# Patient Record
Sex: Female | Born: 1965
Health system: Southern US, Community
[De-identification: ages and names within clinical notes are randomized; demographics above are authoritative.]

## PROBLEM LIST (undated history)

## (undated) DIAGNOSIS — Z973 Presence of spectacles and contact lenses: Secondary | ICD-10-CM

## (undated) DIAGNOSIS — M1990 Unspecified osteoarthritis, unspecified site: Secondary | ICD-10-CM

## (undated) DIAGNOSIS — I501 Left ventricular failure: Secondary | ICD-10-CM

## (undated) DIAGNOSIS — Z8619 Personal history of other infectious and parasitic diseases: Secondary | ICD-10-CM

## (undated) DIAGNOSIS — E039 Hypothyroidism, unspecified: Secondary | ICD-10-CM

## (undated) DIAGNOSIS — Z8489 Family history of other specified conditions: Secondary | ICD-10-CM

## (undated) DIAGNOSIS — N8110 Cystocele, unspecified: Secondary | ICD-10-CM

## (undated) DIAGNOSIS — Z860100 Personal history of colon polyps, unspecified: Secondary | ICD-10-CM

## (undated) DIAGNOSIS — Z8719 Personal history of other diseases of the digestive system: Secondary | ICD-10-CM

## (undated) HISTORY — DX: Personal history of colonic polyps: Z86.010

## (undated) HISTORY — DX: Personal history of colon polyps, unspecified: Z86.0100

## (undated) HISTORY — DX: Hypothyroidism, unspecified: E03.9

## (undated) HISTORY — DX: Personal history of other infectious and parasitic diseases: Z86.19

## (undated) HISTORY — DX: Personal history of other diseases of the digestive system: Z87.19

## (undated) HISTORY — PX: DIAGNOSTIC LAPAROSCOPY: SUR761

## (undated) HISTORY — DX: Left ventricular failure, unspecified: I50.1

## (undated) HISTORY — PX: TONSILLECTOMY: SUR1361

## (undated) HISTORY — PX: CHOLECYSTECTOMY: SHX55

---
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
## 1997-10-06 HISTORY — PX: VARICOSE VEIN SURGERY: SHX832

## 2006-10-06 HISTORY — PX: CHOLECYSTECTOMY, LAPAROSCOPIC: SHX56

## 2009-10-06 HISTORY — PX: PLANTAR FASCIA SURGERY: SHX746

## 2013-10-06 HISTORY — PX: COLONOSCOPY: SHX174

## 2014-03-02 LAB — HM PAP SMEAR: HM Pap smear: NORMAL

## 2014-06-19 LAB — HM COLONOSCOPY

## 2015-04-30 LAB — VITAMIN D 25 HYDROXY (VIT D DEFICIENCY, FRACTURES): Vit D, 25-Hydroxy: 41

## 2016-07-09 LAB — HM MAMMOGRAPHY: HM MAMMO: NORMAL (ref 0–4)

## 2016-09-16 LAB — HEPATIC FUNCTION PANEL
ALT: 23 U/L (ref 7–35)
AST: 22 U/L (ref 13–35)
Alkaline Phosphatase: 94 U/L (ref 25–125)
BILIRUBIN, TOTAL: 0.4 mg/dL

## 2016-09-16 LAB — HCV ANTIBODY

## 2016-09-16 LAB — LIPID PANEL
Cholesterol: 230 mg/dL — AB (ref 0–200)
HDL: 78 mg/dL — AB (ref 35–70)
LDL CALC: 129 mg/dL
Triglycerides: 113 mg/dL (ref 40–160)

## 2016-09-16 LAB — BASIC METABOLIC PANEL
CREATININE: 0.8 mg/dL (ref <=1.1)
GLUCOSE: 88 mg/dL
POTASSIUM: 4.4 mmol/L (ref 3.4–5.3)
SODIUM: 143 mmol/L (ref 137–147)

## 2016-09-16 LAB — CBC AND DIFFERENTIAL
HEMOGLOBIN: 14.2 g/dL (ref 12.0–16.0)
Platelets: 290 10*3/uL (ref 150–399)
WBC: 5.6 10^3/mL

## 2016-09-16 LAB — VITAMIN B12: VITAMIN B 12: 304

## 2016-09-16 LAB — TSH: TSH: 3.29 u[IU]/mL (ref <=5.90)

## 2016-09-16 LAB — HIV ANTIBODY (ROUTINE TESTING W REFLEX): HIV: NEGATIVE

## 2017-01-05 ENCOUNTER — Ambulatory Visit (INDEPENDENT_AMBULATORY_CARE_PROVIDER_SITE_OTHER): Payer: 59 | Admitting: Family Medicine

## 2017-01-05 ENCOUNTER — Encounter: Payer: Self-pay | Admitting: Family Medicine

## 2017-01-05 VITALS — BP 128/86 | HR 57 | Temp 98.1°F | Ht 68.0 in | Wt 240.5 lb

## 2017-01-05 DIAGNOSIS — G8929 Other chronic pain: Secondary | ICD-10-CM

## 2017-01-05 DIAGNOSIS — E039 Hypothyroidism, unspecified: Secondary | ICD-10-CM | POA: Diagnosis not present

## 2017-01-05 DIAGNOSIS — M25511 Pain in right shoulder: Secondary | ICD-10-CM | POA: Diagnosis not present

## 2017-01-05 DIAGNOSIS — E669 Obesity, unspecified: Secondary | ICD-10-CM

## 2017-01-05 MED ORDER — LEVOTHYROXINE SODIUM 50 MCG PO TABS: 50.0000 ug | ORAL_TABLET | Freq: Every day | ORAL | 3 refills | Status: DC

## 2017-01-05 NOTE — Progress Notes (Signed)
Pre visit review using our clinic review tool, if applicable. No additional management support is needed unless otherwise documented below in the visit note. 

## 2017-01-05 NOTE — Addendum Note (Signed)
Addended by: Ria Bush on: 01/05/2017 10:04 AM   Modules accepted: Orders

## 2017-01-05 NOTE — Patient Instructions (Addendum)
You are doing well today - return as needed or for physical in December.  Continue current medicines. Do exercises for rotator cuff injury provided today.

## 2017-01-05 NOTE — Assessment & Plan Note (Signed)
Anticipate acute flare of RTC tendinopathy or other RTC injury. Not as consistent today with biceps tendonitis. Reviewed supportive care - continue NSAID, ice, provided with exercises from [facility] pt advisor on RTC injury. Pt agrees with plan.

## 2017-01-05 NOTE — Assessment & Plan Note (Signed)
Will continue to monitor.

## 2017-01-05 NOTE — Assessment & Plan Note (Signed)
Chronic, stable. Continue current regimen. 

## 2017-01-05 NOTE — Progress Notes (Signed)
BP 128/86 (BP Location: Left Arm, Patient Position: Sitting, Cuff Size: Large)   Pulse (!) 57   Temp 98.1 F (36.7 C) (Oral)   Ht 5\' 8"  (1.727 m)   Wt 240 lb 8 oz (109.1 kg)   SpO2 95%   BMI 36.57 kg/m    CC: new pt to establish care Subjective:    Patient ID: Kirsten Barnes, female    DOB: 1966-04-11, 51 y.o.   MRN: [id number]  HPI: Kirsten Barnes is a 51 y.o. female presenting on 01/05/2017 for Establish Care   Prior saw Dr Sela Hua [facility].  Biceps tendonitis 07/2016 - on ibuprofen for this.  Possible plantar fasciitis L>R - already knows things to do.  Acquired hypothyroidism initial hyper leading to hospitalization for left heart failure (1988).   Preventative: CPE 09/2016 Colon screening 06/2014 Pap 2015 mammo 07/2016 G2P2  LMP ~2012 menopause at age 62 Tetanus 09/2016  From [place]  Lives with husband  Occ: Marine scientist, housewife  Activity: gardening, golf Diet: good water, fruits/vegetables daily, enjoys cooking  Relevant past medical, surgical, family and social history reviewed and updated as indicated. Interim medical history since our last visit reviewed. Allergies and medications reviewed and updated. No outpatient prescriptions prior to visit.   No facility-administered medications prior to visit.      Per HPI unless specifically indicated in ROS section below Review of Systems     Objective:    BP 128/86 (BP Location: Left Arm, Patient Position: Sitting, Cuff Size: Large)   Pulse (!) 57   Temp 98.1 F (36.7 C) (Oral)   Ht 5\' 8"  (1.727 m)   Wt 240 lb 8 oz (109.1 kg)   SpO2 95%   BMI 36.57 kg/m   Wt Readings from Last 3 Encounters:  01/05/17 240 lb 8 oz (109.1 kg)    Physical Exam  Constitutional: She appears well-developed and well-nourished. No distress.  HENT:  Head: Normocephalic and atraumatic.  Mouth/Throat: Oropharynx is clear and moist. No oropharyngeal exudate.  Eyes: Conjunctivae and EOM are  normal. Pupils are equal, round, and reactive to light. No scleral icterus.  Neck: Normal range of motion. Neck supple. No thyromegaly present.  Cardiovascular: Normal rate, regular rhythm, normal heart sounds and intact distal pulses.   No murmur heard. Pulmonary/Chest: Effort normal and breath sounds normal. No respiratory distress. She has no wheezes. She has no rales.  Musculoskeletal: She exhibits no edema.  Left shoulder WNL Right shoulder exam: No deformity of shoulders on inspection. Tender with palpation of anterior shoulder  FROM in abduction and forward flexion. Tender with testing SITS in ext/int rotation. ++ pain with empty can sign. Neg Yerguson, Speed test. Mild impingement. No significant pain with rotation of humeral head in [place] joint.   Lymphadenopathy:    She has no cervical adenopathy.  Skin: Skin is warm and dry. No rash noted. No erythema.  Psychiatric: She has a normal mood and affect.  Nursing note and vitals reviewed.  No results found for this or any previous visit.    Assessment & Plan:   Problem List Items Addressed This Visit    Hypothyroidism    Chronic, stable. Continue current regimen.       Relevant Medications   levothyroxine (SYNTHROID, LEVOTHROID) 50 MCG tablet   Obesity, Class II, BMI 35-39.9, no comorbidity    Will continue to monitor      Right shoulder pain - Primary    Anticipate acute flare  of RTC tendinopathy or other RTC injury. Not as consistent today with biceps tendonitis. Reviewed supportive care - continue NSAID, ice, provided with exercises from [facility] pt advisor on RTC injury. Pt agrees with plan.           Follow up plan: Return in about 9 months (around 10/07/2017) for annual exam, prior fasting for blood work.  Ria Bush, MD

## 2017-01-09 ENCOUNTER — Ambulatory Visit: Payer: Self-pay | Admitting: Family Medicine

## 2017-01-25 ENCOUNTER — Encounter: Payer: Self-pay | Admitting: Family Medicine

## 2017-01-25 DIAGNOSIS — E559 Vitamin D deficiency, unspecified: Secondary | ICD-10-CM | POA: Insufficient documentation

## 2017-01-25 DIAGNOSIS — E538 Deficiency of other specified B group vitamins: Secondary | ICD-10-CM | POA: Insufficient documentation

## 2017-01-28 ENCOUNTER — Encounter: Payer: Self-pay | Admitting: *Deleted

## 2017-09-17 ENCOUNTER — Encounter: Payer: Self-pay | Admitting: Family Medicine

## 2017-09-17 ENCOUNTER — Ambulatory Visit (INDEPENDENT_AMBULATORY_CARE_PROVIDER_SITE_OTHER): Payer: 59 | Admitting: Family Medicine

## 2017-09-17 VITALS — BP 120/82 | HR 63 | Temp 97.8°F | Ht 67.5 in | Wt 232.5 lb

## 2017-09-17 DIAGNOSIS — E538 Deficiency of other specified B group vitamins: Secondary | ICD-10-CM

## 2017-09-17 DIAGNOSIS — E559 Vitamin D deficiency, unspecified: Secondary | ICD-10-CM | POA: Diagnosis not present

## 2017-09-17 DIAGNOSIS — M25511 Pain in right shoulder: Secondary | ICD-10-CM

## 2017-09-17 DIAGNOSIS — E039 Hypothyroidism, unspecified: Secondary | ICD-10-CM

## 2017-09-17 DIAGNOSIS — Z1231 Encounter for screening mammogram for malignant neoplasm of breast: Secondary | ICD-10-CM

## 2017-09-17 DIAGNOSIS — E669 Obesity, unspecified: Secondary | ICD-10-CM

## 2017-09-17 DIAGNOSIS — Z0000 Encounter for general adult medical examination without abnormal findings: Secondary | ICD-10-CM | POA: Diagnosis not present

## 2017-09-17 DIAGNOSIS — E785 Hyperlipidemia, unspecified: Secondary | ICD-10-CM

## 2017-09-17 DIAGNOSIS — Z1239 Encounter for other screening for malignant neoplasm of breast: Secondary | ICD-10-CM

## 2017-09-17 DIAGNOSIS — G8929 Other chronic pain: Secondary | ICD-10-CM

## 2017-09-17 DIAGNOSIS — Z0001 Encounter for general adult medical examination with abnormal findings: Secondary | ICD-10-CM | POA: Insufficient documentation

## 2017-09-17 LAB — LIPID PANEL
CHOL/HDL RATIO: 3
Cholesterol: 233 mg/dL — ABNORMAL HIGH (ref 0–200)
HDL: 77.9 mg/dL (ref >39.00)
LDL CALC: 137 mg/dL — AB (ref 0–99)
NONHDL: 155.21
TRIGLYCERIDES: 90 mg/dL (ref 0.0–149.0)
VLDL: 18 mg/dL (ref 0.0–40.0)

## 2017-09-17 LAB — BASIC METABOLIC PANEL
BUN: 14 mg/dL (ref 6–23)
CO2: 30 mEq/L (ref 19–32)
Calcium: 9.3 mg/dL (ref 8.4–10.5)
Chloride: 102 mEq/L (ref 96–112)
Creatinine, Ser: 0.76 mg/dL (ref 0.40–1.20)
GFR: 84.52 mL/min (ref >60.00)
Glucose, Bld: 87 mg/dL (ref 70–99)
POTASSIUM: 4 meq/L (ref 3.5–5.1)
SODIUM: 138 meq/L (ref 135–145)

## 2017-09-17 LAB — TSH: TSH: 3.95 u[IU]/mL (ref 0.35–4.50)

## 2017-09-17 LAB — VITAMIN D 25 HYDROXY (VIT D DEFICIENCY, FRACTURES): VITD: 36.69 ng/mL (ref 30.00–100.00)

## 2017-09-17 LAB — T4, FREE: FREE T4: 0.91 ng/dL (ref 0.60–1.60)

## 2017-09-17 LAB — VITAMIN B12: Vitamin B-12: 254 pg/mL (ref 211–911)

## 2017-09-17 NOTE — Assessment & Plan Note (Signed)
Update FLP. Not on statin.  The 10-year ASCVD risk score Mikey Bussing DC Brooke Bonito., et al., 2013) is: 1.2%   Values used to calculate the score:     Age: 51 years     Sex: Female     Is Non-Hispanic African American: No     Diabetic: No     Tobacco smoker: No     Systolic Blood Pressure: 468 mmHg     Is BP treated: No     HDL Cholesterol: 78 mg/dL     Total Cholesterol: 230 mg/dL

## 2017-09-17 NOTE — Progress Notes (Signed)
BP 120/82 (BP Location: Left Arm, Patient Position: Sitting, Cuff Size: Large)   Pulse 63   Temp 97.8 F (36.6 C) (Oral)   Ht 5' 7.5" (1.715 m)   Wt 232 lb 8 oz (105.5 kg)   SpO2 96%   BMI 35.88 kg/m    CC: CPE Subjective:    Patient ID: Kirsten Barnes, female    DOB: Jun 24, 1966, 51 y.o.   MRN: [id number]  HPI: Kirsten Barnes is a 51 y.o. female presenting on 09/17/2017 for Annual Exam   Preventative: COLONOSCOPY 2015 polyps, rpt 8 yrs (Dr Julien Nordmann)  Pap 2015 - never abnormal. Desires to space out to 5 yrs mammo 07/2016. Would like to establish locally - [place]. Does breast exams at home without concerns.  G2P2  LMP ~2012 menopause at age 65  Flu shot - declines Tetanus 09/2016 Seat belt use discussed Sunscreen use discussed. No changing moles on skin. Non smoker Alcohol - 1 glass of wine daily Eye exam 1.5 yrs ago.  Dentist - Q6 mo.   From [place] with husband  Occ: Marine scientist, housewife  Activity: gardening, golf, 2 miles on treadmill 4-5 times a week Diet: good water, fruits/vegetables daily, enjoys cooking  Relevant past medical, surgical, family and social history reviewed and updated as indicated. Interim medical history since our last visit reviewed. Allergies and medications reviewed and updated. Outpatient Medications Prior to Visit  Medication Sig Dispense Refill  . cetirizine (ZYRTEC) 10 MG tablet Take 10 mg by mouth daily.    Marland Kitchen ibuprofen (ADVIL,MOTRIN) 200 MG tablet Take 400 mg by mouth daily as needed.    Marland Kitchen levothyroxine (SYNTHROID, LEVOTHROID) 50 MCG tablet Take 1 tablet (50 mcg total) by mouth daily. 90 tablet 3  . Tetrahydrozoline HCl (EYE DROPS OP) Apply 1 drop to eye daily as needed.    . Triamcinolone Acetonide (NASACORT ALLERGY 24HR NA) Place 1 spray into the nose daily as needed.     No facility-administered medications prior to visit.      Per HPI unless specifically indicated in ROS section below Review of Systems  Constitutional: Negative  for activity change, appetite change, chills, fatigue, fever and unexpected weight change.  HENT: Negative for hearing loss.   Eyes: Negative for visual disturbance.  Respiratory: Negative for cough, chest tightness, shortness of breath and wheezing.   Cardiovascular: Negative for chest pain, palpitations and leg swelling.  Gastrointestinal: Negative for abdominal distention, abdominal pain, blood in stool, constipation, diarrhea, nausea and vomiting.  Genitourinary: Negative for difficulty urinating and hematuria.  Musculoskeletal: Negative for arthralgias, myalgias and neck pain.  Skin: Negative for rash.  Neurological: Negative for dizziness, seizures, syncope and headaches.  Hematological: Negative for adenopathy. Does not bruise/bleed easily.  Psychiatric/Behavioral: Negative for dysphoric mood. The patient is not nervous/anxious.        Objective:    BP 120/82 (BP Location: Left Arm, Patient Position: Sitting, Cuff Size: Large)   Pulse 63   Temp 97.8 F (36.6 C) (Oral)   Ht 5' 7.5" (1.715 m)   Wt 232 lb 8 oz (105.5 kg)   SpO2 96%   BMI 35.88 kg/m   Wt Readings from Last 3 Encounters:  09/17/17 232 lb 8 oz (105.5 kg)  01/05/17 240 lb 8 oz (109.1 kg)    Physical Exam  Constitutional: She is oriented to person, place, and time. She appears well-developed and well-nourished. No distress.  HENT:  Head: Normocephalic and atraumatic.  Right Ear: Hearing, tympanic membrane, external ear and  ear canal normal.  Left Ear: Hearing, tympanic membrane, external ear and ear canal normal.  Nose: Nose normal.  Mouth/Throat: Uvula is midline, oropharynx is clear and moist and mucous membranes are normal. No oropharyngeal exudate, posterior oropharyngeal edema or posterior oropharyngeal erythema.  Eyes: Conjunctivae and EOM are normal. Pupils are equal, round, and reactive to light. No scleral icterus.  Neck: Normal range of motion. Neck supple. No thyromegaly present.  Cardiovascular:  Normal rate, regular rhythm, normal heart sounds and intact distal pulses.  No murmur heard. Pulses:      Radial pulses are 2+ on the right side, and 2+ on the left side.  Pulmonary/Chest: Effort normal and breath sounds normal. No respiratory distress. She has no wheezes. She has no rales.  Abdominal: Soft. Bowel sounds are normal. She exhibits no distension and no mass. There is no tenderness. There is no rebound and no guarding.  Musculoskeletal: Normal range of motion. She exhibits no edema.  Lymphadenopathy:    She has no cervical adenopathy.  Neurological: She is alert and oriented to person, place, and time.  CN grossly intact, station and gait intact  Skin: Skin is warm and dry. No rash noted.  Psychiatric: She has a normal mood and affect. Her behavior is normal. Judgment and thought content normal.  Nursing note and vitals reviewed.  Results for orders placed or performed in visit on 01/28/17  HCV Antibody  Result Value Ref Range   HCV Ab <0.1       Assessment & Plan:   Problem List Items Addressed This Visit    Dyslipidemia    Update FLP. Not on statin.  The 10-year ASCVD risk score Mikey Bussing DC Brooke Bonito., et al., 2013) is: 1.2%   Values used to calculate the score:     Age: 55 years     Sex: Female     Is Non-Hispanic African American: No     Diabetic: No     Tobacco smoker: No     Systolic Blood Pressure: 485 mmHg     Is BP treated: No     HDL Cholesterol: 78 mg/dL     Total Cholesterol: 230 mg/dL       Relevant Orders   Lipid panel   Basic metabolic panel   Health maintenance examination - Primary    Preventative protocols reviewed and updated unless pt declined. Discussed healthy diet and lifestyle.  Discussed cervical cancer screening guidelines - pt desires to space out to Q5 yrs. Discussed this is ok if she had co testing done - she will check at records at home.       Hypothyroidism    Update TSH, free T4      Relevant Orders   TSH   T4, free   Obesity,  Class II, BMI 35-39.9, no comorbidity    Discussed healthy diet and lifestyle changes to affect sustainable weight loss.       Right shoulder pain    Persistent. Declines PT referral.       Vitamin B12 deficiency   Relevant Orders   Vitamin B12   Vitamin D deficiency   Relevant Orders   VITAMIN D 25 Hydroxy (Vit-D Deficiency, Fractures)    Other Visit Diagnoses    Breast cancer screening       Relevant Orders   MM DIGITAL SCREENING BILATERAL       Follow up plan: Return in about 1 year (around 09/17/2018) for annual exam, prior fasting for blood work.  Garlon Hatchet  Danise Mina, MD

## 2017-09-17 NOTE — Assessment & Plan Note (Signed)
Update TSH, free T4.  

## 2017-09-17 NOTE — Assessment & Plan Note (Signed)
Persistent. Declines PT referral.

## 2017-09-17 NOTE — Assessment & Plan Note (Signed)
Discussed healthy diet and lifestyle changes to affect sustainable weight loss  

## 2017-09-17 NOTE — Patient Instructions (Addendum)
Labs today.  You are doing well today. Continue heatlhy diet and regular exercise.  We will schedule mammogram at [facility] - ask up front about scheduling.  Return as needed or in 1 year for next physical.   Health Maintenance, Female Adopting a healthy lifestyle and getting preventive care can go a long way to promote health and wellness. Talk with your health care provider about what schedule of regular examinations is right for you. This is a good chance for you to check in with your provider about disease prevention and staying healthy. In between checkups, there are plenty of things you can do on your own. Experts have done a lot of research about which lifestyle changes and preventive measures are most likely to keep you healthy. Ask your health care provider for more information. Weight and diet Eat a healthy diet  Be sure to include plenty of vegetables, fruits, low-fat dairy products, and lean protein.  Do not eat a lot of foods high in solid fats, added sugars, or salt.  Get regular exercise. This is one of the most important things you can do for your health. ? Most adults should exercise for at least 150 minutes each week. The exercise should increase your heart rate and make you sweat (moderate-intensity exercise). ? Most adults should also do strengthening exercises at least twice a week. This is in addition to the moderate-intensity exercise.  Maintain a healthy weight  Body mass index (BMI) is a measurement that can be used to identify possible weight problems. It estimates body fat based on height and weight. Your health care provider can help determine your BMI and help you achieve or maintain a healthy weight.  For females 54 years of age and older: ? A BMI below 18.5 is considered underweight. ? A BMI of 18.5 to 24.9 is normal. ? A BMI of 25 to 29.9 is considered overweight. ? A BMI of 30 and above is considered obese.  Watch levels of cholesterol and  blood lipids  You should start having your blood tested for lipids and cholesterol at 53 years of age, then have this test every 5 years.  You may need to have your cholesterol levels checked more often if: ? Your lipid or cholesterol levels are high. ? You are older than 53 years of age. ? You are at high risk for heart disease.  Cancer screening Lung Cancer  Lung cancer screening is recommended for adults 57-59 years old who are at high risk for lung cancer because of a history of smoking.  A yearly low-dose CT scan of the lungs is recommended for people who: ? Currently smoke. ? Have quit within the past 15 years. ? Have at least a 30-pack-year history of smoking. A pack year is smoking an average of one pack of cigarettes a day for 1 year.  Yearly screening should continue until it has been 15 years since you quit.  Yearly screening should stop if you develop a health problem that would prevent you from having lung cancer treatment.  Breast Cancer  Practice breast self-awareness. This means understanding how your breasts normally appear and feel.  It also means doing regular breast self-exams. Let your health care provider know about any changes, no matter how small.  If you are in your 20s or 30s, you should have a clinical breast exam (CBE) by a health care provider every 1-3 years as part of a regular health exam.  If you are 40  or older, have a CBE every year. Also consider having a breast X-ray (mammogram) every year.  If you have a family history of breast cancer, talk to your health care provider about genetic screening.  If you are at high risk for breast cancer, talk to your health care provider about having an MRI and a mammogram every year.  Breast cancer gene (BRCA) assessment is recommended for women who have family members with BRCA-related cancers. BRCA-related cancers include: ? Breast. ? Ovarian. ? Tubal. ? Peritoneal cancers.  Results of the assessment  will determine the need for genetic counseling and BRCA1 and BRCA2 testing.  Cervical Cancer Your health care provider may recommend that you be screened regularly for cancer of the pelvic organs (ovaries, uterus, and vagina). This screening involves a pelvic examination, including checking for microscopic changes to the surface of your cervix (Pap test). You may be encouraged to have this screening done every 3 years, beginning at age 1.  For women ages 109-65, health care providers may recommend pelvic exams and Pap testing every 3 years, or they may recommend the Pap and pelvic exam, combined with testing for human papilloma virus (HPV), every 5 years. Some types of HPV increase your risk of cervical cancer. Testing for HPV may also be done on women of any age with unclear Pap test results.  Other health care providers may not recommend any screening for nonpregnant women who are considered low risk for pelvic cancer and who do not have symptoms. Ask your health care provider if a screening pelvic exam is right for you.  If you have had past treatment for cervical cancer or a condition that could lead to cancer, you need Pap tests and screening for cancer for at least 20 years after your treatment. If Pap tests have been discontinued, your risk factors (such as having a new sexual partner) need to be reassessed to determine if screening should resume. Some women have medical problems that increase the chance of getting cervical cancer. In these cases, your health care provider may recommend more frequent screening and Pap tests.  Colorectal Cancer  This type of cancer can be detected and often prevented.  Routine colorectal cancer screening usually begins at 53 years of age and continues through 53 years of age.  Your health care provider may recommend screening at an earlier age if you have risk factors for colon cancer.  Your health care provider may also recommend using home test kits to  check for hidden blood in the stool.  A small camera at the end of a tube can be used to examine your colon directly (sigmoidoscopy or colonoscopy). This is done to check for the earliest forms of colorectal cancer.  Routine screening usually begins at age 26.  Direct examination of the colon should be repeated every 5-10 years through 53 years of age. However, you may need to be screened more often if early forms of precancerous polyps or small growths are found.  Skin Cancer  Check your skin from head to toe regularly.  Tell your health care provider about any new moles or changes in moles, especially if there is a change in a mole's shape or color.  Also tell your health care provider if you have a mole that is larger than the size of a pencil eraser.  Always use sunscreen. Apply sunscreen liberally and repeatedly throughout the day.  Protect yourself by wearing long sleeves, pants, a wide-brimmed hat, and sunglasses whenever you are  outside.  Heart disease, diabetes, and high blood pressure  High blood pressure causes heart disease and increases the risk of stroke. High blood pressure is more likely to develop in: ? People who have blood pressure in the high end of the normal range (130-139/85-89 mm Hg). ? People who are overweight or obese. ? People who are African American.  If you are 91-46 years of age, have your blood pressure checked every 3-5 years. If you are 30 years of age or older, have your blood pressure checked every year. You should have your blood pressure measured twice-once when you are at a hospital or clinic, and once when you are not at a hospital or clinic. Record the average of the two measurements. To check your blood pressure when you are not at a hospital or clinic, you can use: ? An automated blood pressure machine at a pharmacy. ? A home blood pressure monitor.  If you are between 58 years and 60 years old, ask your health care provider if you should  take aspirin to prevent strokes.  Have regular diabetes screenings. This involves taking a blood sample to check your fasting blood sugar level. ? If you are at a normal weight and have a low risk for diabetes, have this test once every three years after 53 years of age. ? If you are overweight and have a high risk for diabetes, consider being tested at a younger age or more often. Preventing infection Hepatitis B  If you have a higher risk for hepatitis B, you should be screened for this virus. You are considered at high risk for hepatitis B if: ? You were born in a country where hepatitis B is common. Ask your health care provider which countries are considered high risk. ? Your parents were born in a high-risk country, and you have not been immunized against hepatitis B (hepatitis B vaccine). ? You have HIV or AIDS. ? You use needles to inject street drugs. ? You live with someone who has hepatitis B. ? You have had sex with someone who has hepatitis B. ? You get hemodialysis treatment. ? You take certain medicines for conditions, including cancer, organ transplantation, and autoimmune conditions.  Hepatitis C  Blood testing is recommended for: ? Everyone born from [birth year] through 1967. ? Anyone with known risk factors for hepatitis C.  Sexually transmitted infections (STIs)  You should be screened for sexually transmitted infections (STIs) including gonorrhea and chlamydia if: ? You are sexually active and are younger than 53 years of age. ? You are older than 53 years of age and your health care provider tells you that you are at risk for this type of infection. ? Your sexual activity has changed since you were last screened and you are at an increased risk for chlamydia or gonorrhea. Ask your health care provider if you are at risk.  If you do not have HIV, but are at risk, it may be recommended that you take a prescription medicine daily to prevent HIV infection. This is called  pre-exposure prophylaxis (PrEP). You are considered at risk if: ? You are sexually active and do not regularly use condoms or know the HIV status of your partner(s). ? You take drugs by injection. ? You are sexually active with a partner who has HIV.  Talk with your health care provider about whether you are at high risk of being infected with HIV. If you choose to begin PrEP, you should first be tested  for HIV. You should then be tested every 3 months for as long as you are taking PrEP. Pregnancy  If you are premenopausal and you may become pregnant, ask your health care provider about preconception counseling.  If you may become pregnant, take 400 to 800 micrograms (mcg) of folic acid every day.  If you want to prevent pregnancy, talk to your health care provider about birth control (contraception). Osteoporosis and menopause  Osteoporosis is a disease in which the bones lose minerals and strength with aging. This can result in serious bone fractures. Your risk for osteoporosis can be identified using a bone density scan.  If you are 60 years of age or older, or if you are at risk for osteoporosis and fractures, ask your health care provider if you should be screened.  Ask your health care provider whether you should take a calcium or vitamin D supplement to lower your risk for osteoporosis.  Menopause may have certain physical symptoms and risks.  Hormone replacement therapy may reduce some of these symptoms and risks. Talk to your health care provider about whether hormone replacement therapy is right for you. Follow these instructions at home:  Schedule regular health, dental, and eye exams.  Stay current with your immunizations.  Do not use any tobacco products including cigarettes, chewing tobacco, or electronic cigarettes.  If you are pregnant, do not drink alcohol.  If you are breastfeeding, limit how much and how often you drink alcohol.  Limit alcohol intake to no more  than 1 drink per day for nonpregnant women. One drink equals 12 ounces of beer, 5 ounces of wine, or 1 ounces of hard liquor.  Do not use street drugs.  Do not share needles.  Ask your health care provider for help if you need support or information about quitting drugs.  Tell your health care provider if you often feel depressed.  Tell your health care provider if you have ever been abused or do not feel safe at home. This information is not intended to replace advice given to you by your health care provider. Make sure you discuss any questions you have with your health care provider. Document Released: 04/07/2011 Document Revised: 02/28/2016 Document Reviewed: 06/26/2015 Elsevier Interactive Patient Education  [employer].

## 2017-09-17 NOTE — Assessment & Plan Note (Addendum)
Preventative protocols reviewed and updated unless pt declined. Discussed healthy diet and lifestyle.  Discussed cervical cancer screening guidelines - pt desires to space out to Q5 yrs. Discussed this is ok if she had co testing done - she will check at records at home.

## 2017-09-18 ENCOUNTER — Encounter: Payer: Self-pay | Admitting: Family Medicine

## 2017-09-19 ENCOUNTER — Other Ambulatory Visit: Payer: Self-pay | Admitting: Family Medicine

## 2017-09-19 MED ORDER — VITAMIN B-12 1000 MCG PO TABS: 1000.0000 ug | ORAL_TABLET | Freq: Every day | ORAL | Status: DC

## 2017-12-04 ENCOUNTER — Ambulatory Visit
Admission: RE | Admit: 2017-12-04 | Discharge: 2017-12-04 | Disposition: A | Discharge status: Home / Self Care | Payer: 59 | Source: Ambulatory Visit | Attending: Family Medicine | Admitting: Family Medicine

## 2017-12-04 ENCOUNTER — Encounter: Payer: Self-pay | Admitting: Family Medicine

## 2017-12-04 DIAGNOSIS — Z1239 Encounter for other screening for malignant neoplasm of breast: Secondary | ICD-10-CM

## 2017-12-04 DIAGNOSIS — Z1231 Encounter for screening mammogram for malignant neoplasm of breast: Secondary | ICD-10-CM | POA: Diagnosis present

## 2017-12-04 LAB — HM MAMMOGRAPHY

## 2017-12-15 ENCOUNTER — Inpatient Hospital Stay
Admission: RE | Admit: 2017-12-15 | Discharge: 2017-12-15 | Disposition: A | Discharge status: Home / Self Care | Payer: Self-pay | Source: Ambulatory Visit | Attending: *Deleted | Admitting: *Deleted

## 2017-12-15 ENCOUNTER — Other Ambulatory Visit: Payer: Self-pay | Admitting: *Deleted

## 2017-12-15 DIAGNOSIS — Z9289 Personal history of other medical treatment: Secondary | ICD-10-CM

## 2018-01-30 ENCOUNTER — Other Ambulatory Visit: Payer: Self-pay | Admitting: Family Medicine

## 2018-06-21 ENCOUNTER — Other Ambulatory Visit: Payer: Self-pay | Admitting: Family Medicine

## 2018-09-14 ENCOUNTER — Other Ambulatory Visit: Payer: Self-pay | Admitting: Family Medicine

## 2018-09-16 ENCOUNTER — Other Ambulatory Visit (INDEPENDENT_AMBULATORY_CARE_PROVIDER_SITE_OTHER): Payer: 59

## 2018-09-16 ENCOUNTER — Other Ambulatory Visit: Payer: Self-pay | Admitting: Family Medicine

## 2018-09-16 DIAGNOSIS — E538 Deficiency of other specified B group vitamins: Secondary | ICD-10-CM | POA: Diagnosis not present

## 2018-09-16 DIAGNOSIS — E039 Hypothyroidism, unspecified: Secondary | ICD-10-CM | POA: Diagnosis not present

## 2018-09-16 DIAGNOSIS — E559 Vitamin D deficiency, unspecified: Secondary | ICD-10-CM

## 2018-09-16 DIAGNOSIS — R7989 Other specified abnormal findings of blood chemistry: Secondary | ICD-10-CM

## 2018-09-16 DIAGNOSIS — E785 Hyperlipidemia, unspecified: Secondary | ICD-10-CM

## 2018-09-16 LAB — COMPREHENSIVE METABOLIC PANEL
ALT: 34 U/L (ref 0–35)
AST: 30 U/L (ref 0–37)
Albumin: 4.3 g/dL (ref 3.5–5.2)
Alkaline Phosphatase: 84 U/L (ref 39–117)
BUN: 12 mg/dL (ref 6–23)
CHLORIDE: 104 meq/L (ref 96–112)
CO2: 30 mEq/L (ref 19–32)
Calcium: 9.5 mg/dL (ref 8.4–10.5)
Creatinine, Ser: 0.84 mg/dL (ref 0.40–1.20)
GFR: 75.02 mL/min (ref >60.00)
Glucose, Bld: 90 mg/dL (ref 70–99)
Potassium: 3.8 mEq/L (ref 3.5–5.1)
Sodium: 140 mEq/L (ref 135–145)
TOTAL PROTEIN: 7.4 g/dL (ref 6.0–8.3)
Total Bilirubin: 0.8 mg/dL (ref 0.2–1.2)

## 2018-09-16 LAB — LIPID PANEL
Cholesterol: 203 mg/dL — ABNORMAL HIGH (ref 0–200)
HDL: 69.2 mg/dL (ref >39.00)
LDL CALC: 115 mg/dL — AB (ref 0–99)
NonHDL: 134.23
Total CHOL/HDL Ratio: 3
Triglycerides: 97 mg/dL (ref 0.0–149.0)
VLDL: 19.4 mg/dL (ref 0.0–40.0)

## 2018-09-16 LAB — T4, FREE: Free T4: 0.81 ng/dL (ref 0.60–1.60)

## 2018-09-16 LAB — TSH: TSH: 5.36 u[IU]/mL — ABNORMAL HIGH (ref 0.35–4.50)

## 2018-09-16 LAB — VITAMIN B12: Vitamin B-12: 244 pg/mL (ref 211–911)

## 2018-09-16 LAB — VITAMIN D 25 HYDROXY (VIT D DEFICIENCY, FRACTURES): VITD: 34.58 ng/mL (ref 30.00–100.00)

## 2018-09-19 ENCOUNTER — Encounter: Payer: Self-pay | Admitting: Family Medicine

## 2018-09-19 NOTE — Assessment & Plan Note (Signed)
Preventative protocols reviewed and updated unless pt declined. Discussed healthy diet and lifestyle.  

## 2018-09-19 NOTE — Progress Notes (Signed)
BP 122/68 (BP Location: Left Arm, Patient Position: Sitting, Cuff Size: Large)   Pulse 74   Temp 97.8 F (36.6 C) (Oral)   Ht 5' 7.5" (1.715 m)   Wt 233 lb 4 oz (105.8 kg)   SpO2 97%   BMI 35.99 kg/m    CC: CPE Subjective:    Patient ID: Kirsten Barnes, female    DOB: 1965/10/09, 52 y.o.   MRN: [id number]  HPI: Kirsten Barnes is a 53 y.o. female presenting on 09/20/2018 for Annual Exam   L anterior knee pain worse going up stairs. Ibuprofen helps some.  Joints aching including hands, feet, elbows. No redness or swelling or warmth. Regularly sews using machine.   Notes some hypothyroid symptoms - cold intolerance, skin changes, trouble losing weight, arthralgias.   Preventative: COLONOSCOPY 2015 polyps, rpt 5 yrs (Dr Julien Nordmann)  Pap 2015 - never abnormal. Desires to space out to 5 yrs Mammo 12/2017 Birads1. Does breast exams at home without concerns.  G2P2  LMP ~2012 menopause at age 60  Flu shot - declines Tetanus 09/2016 Seat belt use discussed Sunscreen use discussed. No changing moles on skin. Non smoker Alcohol - 1 glass of wine with dinner regularly  Dentist - Q6 mo  Eye exam yearly   From [place] with husband  PJA:SNKNL, housewife  Activity: gardening, golf, 2 miles on treadmill 4-5 times a week  Diet: good water, fruits/vegetables daily, enjoys cooking   Relevant past medical, surgical, family and social history reviewed and updated as indicated. Interim medical history since our last visit reviewed. Allergies and medications reviewed and updated. Outpatient Medications Prior to Visit  Medication Sig Dispense Refill  . cetirizine (ZYRTEC) 10 MG tablet Take 10 mg by mouth daily.    Marland Kitchen ibuprofen (ADVIL,MOTRIN) 200 MG tablet Take 400 mg by mouth daily as needed.    . Tetrahydrozoline HCl (EYE DROPS OP) Apply 1 drop to eye daily as needed.    . Triamcinolone Acetonide (NASACORT ALLERGY 24HR NA) Place 1 spray into the nose daily as needed.    . vitamin B-12  (CYANOCOBALAMIN) 1000 MCG tablet Take 1 tablet (1,000 mcg total) by mouth daily.    Marland Kitchen levothyroxine (SYNTHROID, LEVOTHROID) 50 MCG tablet TAKE 1 TABLET BY MOUTH  DAILY 90 tablet 0   No facility-administered medications prior to visit.      Per HPI unless specifically indicated in ROS section below Review of Systems  Constitutional: Negative for activity change, appetite change, chills, fatigue, fever and unexpected weight change.  HENT: Negative for hearing loss.   Eyes: Negative for visual disturbance.  Respiratory: Negative for cough, chest tightness, shortness of breath and wheezing.   Cardiovascular: Negative for chest pain, palpitations and leg swelling.  Gastrointestinal: Negative for abdominal distention, abdominal pain, blood in stool, constipation, diarrhea, nausea and vomiting.  Genitourinary: Negative for difficulty urinating and hematuria.  Musculoskeletal: Negative for arthralgias, myalgias and neck pain.  Skin: Negative for rash.  Neurological: Negative for dizziness, seizures, syncope and headaches.  Hematological: Negative for adenopathy. Does not bruise/bleed easily.  Psychiatric/Behavioral: Negative for dysphoric mood. The patient is not nervous/anxious.        Objective:    BP 122/68 (BP Location: Left Arm, Patient Position: Sitting, Cuff Size: Large)   Pulse 74   Temp 97.8 F (36.6 C) (Oral)   Ht 5' 7.5" (1.715 m)   Wt 233 lb 4 oz (105.8 kg)   SpO2 97%   BMI 35.99 kg/m   Wt Readings  from Last 3 Encounters:  09/20/18 233 lb 4 oz (105.8 kg)  09/17/17 232 lb 8 oz (105.5 kg)  01/05/17 240 lb 8 oz (109.1 kg)    Physical Exam Vitals signs and nursing note reviewed.  Constitutional:      General: She is not in acute distress.    Appearance: She is well-developed.  HENT:     Head: Normocephalic and atraumatic.     Right Ear: Hearing, tympanic membrane, ear canal and external ear normal.     Left Ear: Hearing, tympanic membrane, ear canal and external ear  normal.     Nose: No mucosal edema or rhinorrhea.     Right Sinus: No maxillary sinus tenderness or frontal sinus tenderness.     Left Sinus: No maxillary sinus tenderness or frontal sinus tenderness.     Mouth/Throat:     Pharynx: Uvula midline. No oropharyngeal exudate or posterior oropharyngeal erythema.     Tonsils: No tonsillar abscesses.  Eyes:     General: No scleral icterus.    Conjunctiva/sclera: Conjunctivae normal.     Pupils: Pupils are equal, round, and reactive to light.  Neck:     Musculoskeletal: Normal range of motion and neck supple.  Cardiovascular:     Rate and Rhythm: Normal rate and regular rhythm.     Heart sounds: Normal heart sounds. No murmur.  Pulmonary:     Effort: Pulmonary effort is normal. No respiratory distress.     Breath sounds: Normal breath sounds. No wheezing or rales.  Musculoskeletal:        General: No swelling.     Comments: R knee WNL L knee exam: No deformity on inspection. No pain with palpation of knee landmarks. No effusion/swelling noted. FROM in flex/extension without crepitus. No popliteal fullness. Neg drawer test. Neg mcmurray test. No pain with valgus/varus stress. No PFgrind. No abnormal patellar mobility.   Lymphadenopathy:     Cervical: No cervical adenopathy.  Skin:    General: Skin is warm and dry.     Findings: No rash.    Lab Results  Component Value Date   TSH 5.36 (H) 09/16/2018    Lab Results  Component Value Date   CHOL 203 (H) 09/16/2018   HDL 69.20 09/16/2018   LDLCALC 115 (H) 09/16/2018   TRIG 97.0 09/16/2018   CHOLHDL 3 09/16/2018    Results for orders placed or performed in visit on 09/16/18  T4, free  Result Value Ref Range   Free T4 0.81 0.60 - 1.60 ng/dL      Assessment & Plan:   Problem List Items Addressed This Visit    Vitamin B12 deficiency    Remains borderline low despite regular 1053mcg oral sublingual replacement - B12 shot today, check IF next labs.       Relevant Orders     Intrinsic Factor Antibodies   Obesity, Class II, BMI 35-39.9, no comorbidity    Encouraged healthy diet and lifestyle changes to affect sustainable weight loss      Left anterior knee pain    Overall benign exam today. Anticipate PFPS. Discussed with patient, recommended lateral leg raises to strengthen VMO, exercise handout provided.      Hypothyroidism    TSH elevated along with endorsed hypothyroid symptoms - will increase levothyroxine to 44mcg daily and recheck in 2 months. Pt agrees with plan.       Relevant Medications   levothyroxine (SYNTHROID, LEVOTHROID) 75 MCG tablet   Other Relevant Orders   TSH  T3   T4, free   Health maintenance examination - Primary    Preventative protocols reviewed and updated unless pt declined. Discussed healthy diet and lifestyle.       Dyslipidemia    Chronic, stable off meds. The 10-year ASCVD risk score Mikey Bussing DC Brooke Bonito., et al., 2013) is: 1.3%   Values used to calculate the score:     Age: 65 years     Sex: Female     Is Non-Hispanic African American: No     Diabetic: No     Tobacco smoker: No     Systolic Blood Pressure: 824 mmHg     Is BP treated: No     HDL Cholesterol: 69.2 mg/dL     Total Cholesterol: 203 mg/dL           Meds ordered this encounter  Medications  . levothyroxine (SYNTHROID, LEVOTHROID) 75 MCG tablet    Sig: Take 1 tablet (75 mcg total) by mouth daily.    Dispense:  90 tablet    Refill:  3   Orders Placed This Encounter  Procedures  . TSH    Standing Status:   Future    Standing Expiration Date:   09/21/2019  . T3    Standing Status:   Future    Standing Expiration Date:   09/21/2019  . T4, free    Standing Status:   Future    Standing Expiration Date:   09/21/2019  . Intrinsic Factor Antibodies    Standing Status:   Future    Standing Expiration Date:   09/21/2019    Follow up plan: Return in about 1 year (around 09/21/2019) for annual exam, prior fasting for blood work.  Ria Bush, MD

## 2018-09-20 ENCOUNTER — Ambulatory Visit (INDEPENDENT_AMBULATORY_CARE_PROVIDER_SITE_OTHER): Payer: 59 | Admitting: Family Medicine

## 2018-09-20 ENCOUNTER — Encounter: Payer: Self-pay | Admitting: Family Medicine

## 2018-09-20 VITALS — BP 122/68 | HR 74 | Temp 97.8°F | Ht 67.5 in | Wt 233.2 lb

## 2018-09-20 DIAGNOSIS — E039 Hypothyroidism, unspecified: Secondary | ICD-10-CM

## 2018-09-20 DIAGNOSIS — Z0000 Encounter for general adult medical examination without abnormal findings: Secondary | ICD-10-CM

## 2018-09-20 DIAGNOSIS — E785 Hyperlipidemia, unspecified: Secondary | ICD-10-CM | POA: Diagnosis not present

## 2018-09-20 DIAGNOSIS — E538 Deficiency of other specified B group vitamins: Secondary | ICD-10-CM

## 2018-09-20 DIAGNOSIS — E669 Obesity, unspecified: Secondary | ICD-10-CM

## 2018-09-20 DIAGNOSIS — M25562 Pain in left knee: Secondary | ICD-10-CM | POA: Diagnosis not present

## 2018-09-20 MED ORDER — CYANOCOBALAMIN 1000 MCG/ML IJ SOLN
1000.0000 ug | Freq: Once | INTRAMUSCULAR | Status: AC
  Administered 2018-09-20: 1000 ug via INTRAMUSCULAR

## 2018-09-20 MED ORDER — LEVOTHYROXINE SODIUM 75 MCG PO TABS: 75.0000 ug | ORAL_TABLET | Freq: Every day | ORAL | 3 refills | Status: DC

## 2018-09-20 MED ORDER — CYANOCOBALAMIN 1000 MCG/ML IJ SOLN: 1000.0000 ug | Freq: Once | INTRAMUSCULAR | Status: DC

## 2018-09-20 NOTE — Assessment & Plan Note (Signed)
Overall benign exam today. Anticipate PFPS. Discussed with patient, recommended lateral leg raises to strengthen VMO, exercise handout provided.

## 2018-09-20 NOTE — Assessment & Plan Note (Signed)
Chronic, stable off meds. The 10-year ASCVD risk score Mikey Bussing DC Brooke Bonito., et al., 2013) is: 1.3%   Values used to calculate the score:     Age: 52 years     Sex: Female     Is Non-Hispanic African American: No     Diabetic: No     Tobacco smoker: No     Systolic Blood Pressure: 129 mmHg     Is BP treated: No     HDL Cholesterol: 69.2 mg/dL     Total Cholesterol: 203 mg/dL

## 2018-09-20 NOTE — Addendum Note (Signed)
Addended by: Brenton Grills on: [id number] 09:27 AM   Modules accepted: Orders

## 2018-09-20 NOTE — Assessment & Plan Note (Signed)
TSH elevated along with endorsed hypothyroid symptoms - will increase levothyroxine to 28mcg daily and recheck in 2 months. Pt agrees with plan.

## 2018-09-20 NOTE — Assessment & Plan Note (Signed)
Encouraged healthy diet and lifestyle changes to affect sustainable weight loss.  

## 2018-09-20 NOTE — Patient Instructions (Addendum)
b12 shot today Thyroid was a bit underactive as expected - increase levothyroxine to 16mg daily. Return in 2 months for lab visit only to recheck thyroid function.  For knees - try knee exercises provided today. If no better, let me know for formal physical therapy.  Cholesterol levels looking good.  Return as needed or in 1 year for next physical.  Health Maintenance, Female Adopting a healthy lifestyle and getting preventive care can go a long way to promote health and wellness. Talk with your health care provider about what schedule of regular examinations is right for you. This is a good chance for you to check in with your provider about disease prevention and staying healthy. In between checkups, there are plenty of things you can do on your own. Experts have done a lot of research about which lifestyle changes and preventive measures are most likely to keep you healthy. Ask your health care provider for more information. Weight and diet Eat a healthy diet  Be sure to include plenty of vegetables, fruits, low-fat dairy products, and lean protein.  Do not eat a lot of foods high in solid fats, added sugars, or salt.  Get regular exercise. This is one of the most important things you can do for your health. ? Most adults should exercise for at least 150 minutes each week. The exercise should increase your heart rate and make you sweat (moderate-intensity exercise). ? Most adults should also do strengthening exercises at least twice a week. This is in addition to the moderate-intensity exercise.  Maintain a healthy weight  Body mass index (BMI) is a measurement that can be used to identify possible weight problems. It estimates body fat based on height and weight. Your health care provider can help determine your BMI and help you achieve or maintain a healthy weight.  For females 272years of age and older: ? A BMI below 18.5 is considered underweight. ? A BMI of 18.5 to 24.9 is  normal. ? A BMI of 25 to 29.9 is considered overweight. ? A BMI of 30 and above is considered obese.  Watch levels of cholesterol and blood lipids  You should start having your blood tested for lipids and cholesterol at 54years of age, then have this test every 5 years.  You may need to have your cholesterol levels checked more often if: ? Your lipid or cholesterol levels are high. ? You are older than 54years of age. ? You are at high risk for heart disease.  Cancer screening Lung Cancer  Lung cancer screening is recommended for adults 570870years old who are at high risk for lung cancer because of a history of smoking.  A yearly low-dose CT scan of the lungs is recommended for people who: ? Currently smoke. ? Have quit within the past 15 years. ? Have at least a 30-pack-year history of smoking. A pack year is smoking an average of one pack of cigarettes a day for 1 year.  Yearly screening should continue until it has been 15 years since you quit.  Yearly screening should stop if you develop a health problem that would prevent you from having lung cancer treatment.  Breast Cancer  Practice breast self-awareness. This means understanding how your breasts normally appear and feel.  It also means doing regular breast self-exams. Let your health care provider know about any changes, no matter how small.  If you are in your 20s or 30s, you should have a clinical breast  exam (CBE) by a health care provider every 1-3 years as part of a regular health exam.  If you are 68 or older, have a CBE every year. Also consider having a breast X-ray (mammogram) every year.  If you have a family history of breast cancer, talk to your health care provider about genetic screening.  If you are at high risk for breast cancer, talk to your health care provider about having an MRI and a mammogram every year.  Breast cancer gene (BRCA) assessment is recommended for women who have family members  with BRCA-related cancers. BRCA-related cancers include: ? Breast. ? Ovarian. ? Tubal. ? Peritoneal cancers.  Results of the assessment will determine the need for genetic counseling and BRCA1 and BRCA2 testing.  Cervical Cancer Your health care provider may recommend that you be screened regularly for cancer of the pelvic organs (ovaries, uterus, and vagina). This screening involves a pelvic examination, including checking for microscopic changes to the surface of your cervix (Pap test). You may be encouraged to have this screening done every 3 years, beginning at age 37.  For women ages 54-65, health care providers may recommend pelvic exams and Pap testing every 3 years, or they may recommend the Pap and pelvic exam, combined with testing for human papilloma virus (HPV), every 5 years. Some types of HPV increase your risk of cervical cancer. Testing for HPV may also be done on women of any age with unclear Pap test results.  Other health care providers may not recommend any screening for nonpregnant women who are considered low risk for pelvic cancer and who do not have symptoms. Ask your health care provider if a screening pelvic exam is right for you.  If you have had past treatment for cervical cancer or a condition that could lead to cancer, you need Pap tests and screening for cancer for at least 20 years after your treatment. If Pap tests have been discontinued, your risk factors (such as having a new sexual partner) need to be reassessed to determine if screening should resume. Some women have medical problems that increase the chance of getting cervical cancer. In these cases, your health care provider may recommend more frequent screening and Pap tests.  Colorectal Cancer  This type of cancer can be detected and often prevented.  Routine colorectal cancer screening usually begins at 55 years of age and continues through 54 years of age.  Your health care provider may recommend  screening at an earlier age if you have risk factors for colon cancer.  Your health care provider may also recommend using home test kits to check for hidden blood in the stool.  A small camera at the end of a tube can be used to examine your colon directly (sigmoidoscopy or colonoscopy). This is done to check for the earliest forms of colorectal cancer.  Routine screening usually begins at age 9.  Direct examination of the colon should be repeated every 5-10 years through 54 years of age. However, you may need to be screened more often if early forms of precancerous polyps or small growths are found.  Skin Cancer  Check your skin from head to toe regularly.  Tell your health care provider about any new moles or changes in moles, especially if there is a change in a mole's shape or color.  Also tell your health care provider if you have a mole that is larger than the size of a pencil eraser.  Always use sunscreen. Apply sunscreen  liberally and repeatedly throughout the day.  Protect yourself by wearing long sleeves, pants, a wide-brimmed hat, and sunglasses whenever you are outside.  Heart disease, diabetes, and high blood pressure  High blood pressure causes heart disease and increases the risk of stroke. High blood pressure is more likely to develop in: ? People who have blood pressure in the high end of the normal range (130-139/85-89 mm Hg). ? People who are overweight or obese. ? People who are African American.  If you are 45-77 years of age, have your blood pressure checked every 3-5 years. If you are 58 years of age or older, have your blood pressure checked every year. You should have your blood pressure measured twice-once when you are at a hospital or clinic, and once when you are not at a hospital or clinic. Record the average of the two measurements. To check your blood pressure when you are not at a hospital or clinic, you can use: ? An automated blood pressure machine at  a pharmacy. ? A home blood pressure monitor.  If you are between 72 years and 68 years old, ask your health care provider if you should take aspirin to prevent strokes.  Have regular diabetes screenings. This involves taking a blood sample to check your fasting blood sugar level. ? If you are at a normal weight and have a low risk for diabetes, have this test once every three years after 54 years of age. ? If you are overweight and have a high risk for diabetes, consider being tested at a younger age or more often. Preventing infection Hepatitis B  If you have a higher risk for hepatitis B, you should be screened for this virus. You are considered at high risk for hepatitis B if: ? You were born in a country where hepatitis B is common. Ask your health care provider which countries are considered high risk. ? Your parents were born in a high-risk country, and you have not been immunized against hepatitis B (hepatitis B vaccine). ? You have HIV or AIDS. ? You use needles to inject street drugs. ? You live with someone who has hepatitis B. ? You have had sex with someone who has hepatitis B. ? You get hemodialysis treatment. ? You take certain medicines for conditions, including cancer, organ transplantation, and autoimmune conditions.  Hepatitis C  Blood testing is recommended for: ? Everyone born from [birth year] through 1967. ? Anyone with known risk factors for hepatitis C.  Sexually transmitted infections (STIs)  You should be screened for sexually transmitted infections (STIs) including gonorrhea and chlamydia if: ? You are sexually active and are younger than 54 years of age. ? You are older than 54 years of age and your health care provider tells you that you are at risk for this type of infection. ? Your sexual activity has changed since you were last screened and you are at an increased risk for chlamydia or gonorrhea. Ask your health care provider if you are at risk.  If you do  not have HIV, but are at risk, it may be recommended that you take a prescription medicine daily to prevent HIV infection. This is called pre-exposure prophylaxis (PrEP). You are considered at risk if: ? You are sexually active and do not regularly use condoms or know the HIV status of your partner(s). ? You take drugs by injection. ? You are sexually active with a partner who has HIV.  Talk with your health care provider about  whether you are at high risk of being infected with HIV. If you choose to begin PrEP, you should first be tested for HIV. You should then be tested every 3 months for as long as you are taking PrEP. Pregnancy  If you are premenopausal and you may become pregnant, ask your health care provider about preconception counseling.  If you may become pregnant, take 400 to 800 micrograms (mcg) of folic acid every day.  If you want to prevent pregnancy, talk to your health care provider about birth control (contraception). Osteoporosis and menopause  Osteoporosis is a disease in which the bones lose minerals and strength with aging. This can result in serious bone fractures. Your risk for osteoporosis can be identified using a bone density scan.  If you are 80 years of age or older, or if you are at risk for osteoporosis and fractures, ask your health care provider if you should be screened.  Ask your health care provider whether you should take a calcium or vitamin D supplement to lower your risk for osteoporosis.  Menopause may have certain physical symptoms and risks.  Hormone replacement therapy may reduce some of these symptoms and risks. Talk to your health care provider about whether hormone replacement therapy is right for you. Follow these instructions at home:  Schedule regular health, dental, and eye exams.  Stay current with your immunizations.  Do not use any tobacco products including cigarettes, chewing tobacco, or electronic cigarettes.  If you are  pregnant, do not drink alcohol.  If you are breastfeeding, limit how much and how often you drink alcohol.  Limit alcohol intake to no more than 1 drink per day for nonpregnant women. One drink equals 12 ounces of beer, 5 ounces of wine, or 1 ounces of hard liquor.  Do not use street drugs.  Do not share needles.  Ask your health care provider for help if you need support or information about quitting drugs.  Tell your health care provider if you often feel depressed.  Tell your health care provider if you have ever been abused or do not feel safe at home. This information is not intended to replace advice given to you by your health care provider. Make sure you discuss any questions you have with your health care provider. Document Released: 04/07/2011 Document Revised: 02/28/2016 Document Reviewed: 06/26/2015 Elsevier Interactive Patient Education  [employer].

## 2018-09-20 NOTE — Assessment & Plan Note (Signed)
Remains borderline low despite regular 1064mcg oral sublingual replacement - B12 shot today, check IF next labs.

## 2018-09-20 NOTE — Addendum Note (Signed)
Addended by: Brenton Grills on: 77/82/4235 09:20 AM   Modules accepted: Orders

## 2018-11-22 ENCOUNTER — Other Ambulatory Visit (INDEPENDENT_AMBULATORY_CARE_PROVIDER_SITE_OTHER): Payer: 59

## 2018-11-22 DIAGNOSIS — E538 Deficiency of other specified B group vitamins: Secondary | ICD-10-CM | POA: Diagnosis not present

## 2018-11-22 DIAGNOSIS — E039 Hypothyroidism, unspecified: Secondary | ICD-10-CM

## 2018-11-23 LAB — T4, FREE: Free T4: 0.9 ng/dL (ref 0.60–1.60)

## 2018-11-23 LAB — TSH: TSH: 3.88 u[IU]/mL (ref 0.35–4.50)

## 2018-11-25 LAB — T3: T3, Total: 112 ng/dL (ref 76–181)

## 2018-11-25 LAB — INTRINSIC FACTOR ANTIBODIES: Intrinsic Factor: NEGATIVE

## 2018-11-26 ENCOUNTER — Ambulatory Visit: Payer: 59 | Admitting: Primary Care

## 2018-11-26 ENCOUNTER — Encounter: Payer: Self-pay | Admitting: Primary Care

## 2018-11-26 VITALS — BP 124/72 | HR 56 | Temp 98.2°F | Ht 67.5 in | Wt 236.2 lb

## 2018-11-26 DIAGNOSIS — R1030 Lower abdominal pain, unspecified: Secondary | ICD-10-CM | POA: Insufficient documentation

## 2018-11-26 LAB — CBC WITH DIFFERENTIAL/PLATELET
Absolute Monocytes: 1046 cells/uL — ABNORMAL HIGH (ref 200–950)
Basophils Absolute: 37 cells/uL (ref 0–200)
Basophils Relative: 0.3 %
EOS PCT: 0.5 %
Eosinophils Absolute: 62 cells/uL (ref 15–500)
HCT: 39.8 % (ref 35.0–45.0)
Hemoglobin: 14 g/dL (ref 11.7–15.5)
Lymphs Abs: 2116 cells/uL (ref 850–3900)
MCH: 30 pg (ref 27.0–33.0)
MCHC: 35.2 g/dL (ref 32.0–36.0)
MCV: 85.4 fL (ref 80.0–100.0)
MPV: 10.3 fL (ref 7.5–12.5)
Monocytes Relative: 8.5 %
Neutro Abs: 9041 cells/uL — ABNORMAL HIGH (ref 1500–7800)
Neutrophils Relative %: 73.5 %
Platelets: 317 10*3/uL (ref 140–400)
RBC: 4.66 10*6/uL (ref 3.80–5.10)
RDW: 12.9 % (ref 11.0–15.0)
Total Lymphocyte: 17.2 %
WBC: 12.3 10*3/uL — AB (ref 3.8–10.8)

## 2018-11-26 LAB — POC URINALSYSI DIPSTICK (AUTOMATED)
Bilirubin, UA: NEGATIVE
Glucose, UA: NEGATIVE
Ketones, UA: NEGATIVE
Leukocytes, UA: NEGATIVE
Nitrite, UA: NEGATIVE
Protein, UA: NEGATIVE
RBC UA: NEGATIVE
Spec Grav, UA: 1.01 (ref 1.010–1.025)
Urobilinogen, UA: 0.2 E.U./dL
pH, UA: 7 (ref 5.0–8.0)

## 2018-11-26 LAB — COMPREHENSIVE METABOLIC PANEL
AG Ratio: 1.5 (calc) (ref 1.0–2.5)
ALBUMIN MSPROF: 4.1 g/dL (ref 3.6–5.1)
ALT: 23 U/L (ref 6–29)
AST: 22 U/L (ref 10–35)
Alkaline phosphatase (APISO): 97 U/L (ref 37–153)
BUN: 9 mg/dL (ref 7–25)
CO2: 27 mmol/L (ref 20–32)
Calcium: 9.5 mg/dL (ref 8.6–10.4)
Chloride: 101 mmol/L (ref 98–110)
Creat: 0.76 mg/dL (ref 0.50–1.05)
Globulin: 2.8 g/dL (calc) (ref 1.9–3.7)
Glucose, Bld: 88 mg/dL (ref 65–99)
Potassium: 4 mmol/L (ref 3.5–5.3)
SODIUM: 138 mmol/L (ref 135–146)
TOTAL PROTEIN: 6.9 g/dL (ref 6.1–8.1)
Total Bilirubin: 1 mg/dL (ref 0.2–1.2)

## 2018-11-26 MED ORDER — METRONIDAZOLE 500 MG PO TABS: 500.0000 mg | ORAL_TABLET | Freq: Three times a day (TID) | ORAL | 0 refills | Status: AC

## 2018-11-26 MED ORDER — CIPROFLOXACIN HCL 500 MG PO TABS: 500.0000 mg | ORAL_TABLET | Freq: Two times a day (BID) | ORAL | 0 refills | Status: DC

## 2018-11-26 NOTE — Assessment & Plan Note (Signed)
HPI and exam today suspicious for acute diverticulitis vs colitis.  UA today negative. She endorses a history of diverticulosis from colonoscopy in 2015, I could not pull up the report in [place]. Given characteristics of symptoms coupled with exam, will treat for presumed diverticulitis with Cipro/Flagyl courses. Discussed clear liquid diet for next 2-3 days or until symptoms improve.  Strict ED precautions provided.

## 2018-11-26 NOTE — Progress Notes (Signed)
Subjective:    Patient ID: Kirsten Barnes, female    DOB: November 23, 1965, 53 y.o.   MRN: [id number]  HPI  Ms. Laplante is a 53 year old female with a history of hypothyroidism, dyslipidemia, diverticulosis, vitamin B12 deficiency who presents today with a chief complaint of abdominal pain.  Her pain is located to the bilateral lower quadrants (mostly left), also with bloating to entire abdomen. Her symptoms began yesterday morning. This has occurred on two separate occasions. The first episode occurred in early January 2020 with LLQ abdominal pain with diarrhea and bloating. The second episode occurred 2-3 weeks ago with bilateral abdominal pain with diarrhea and bloating.   She does feel nauseated, denies fevers, vomiting, bloody stools, dysuria, hematuria. She has a history of diverticulosis on prior colonoscopy in 2015 per patient. She has never had diverticulitis. Eating and drinking does affect her symptoms.  Review of Systems  Constitutional: Positive for appetite change. Negative for fever.  Gastrointestinal: Positive for abdominal pain, diarrhea and nausea. Negative for blood in stool, constipation and vomiting.  Genitourinary: Negative for dysuria and frequency.       Past Medical History:  Diagnosis Date  . Cardiac failure left ([place])    due to overactive thyroid  . History of chickenpox   . History of colon polyps   . Hypothyroidism    initial hyper     Social History   Socioeconomic History  . Marital status: Married    Spouse name: Not on file  . Number of children: Not on file  . Years of education: Not on file  . Highest education level: Not on file  Occupational History  . Not on file  Social Needs  . Financial resource strain: Not on file  . Food insecurity:    Worry: Not on file    Inability: Not on file  . Transportation needs:    Medical: Not on file    Non-medical: Not on file  Tobacco Use  . Smoking status: Never Smoker  . Smokeless tobacco: Never  Used  Substance and Sexual Activity  . Alcohol use: Yes    Comment: glass of wine with dinner (daily)  . Drug use: No  . Sexual activity: Not on file  Lifestyle  . Physical activity:    Days per week: Not on file    Minutes per session: Not on file  . Stress: Not on file  Relationships  . Social connections:    Talks on phone: Not on file    Gets together: Not on file    Attends religious service: Not on file    Active member of club or organization: Not on file    Attends meetings of clubs or organizations: Not on file    Relationship status: Not on file  . Intimate partner violence:    Fear of current or ex partner: Not on file    Emotionally abused: Not on file    Physically abused: Not on file    Forced sexual activity: Not on file  Other Topics Concern  . Not on file  Social History Narrative   From [place]    Lives with husband    Occ: Marine scientist, housewife    Activity: gardening, golf. Enjoys sewing   Diet: good water, fruits/vegetables daily, enjoys cooking    Past Surgical History:  Procedure Laterality Date  . CHOLECYSTECTOMY, LAPAROSCOPIC  2008  . COLONOSCOPY  2015   HP, TA polyps, rpt 5 yrs (Dr Julien Nordmann)  . PLANTAR  FASCIA SURGERY  2011   with a spur removed  . TONSILLECTOMY AND ADENOIDECTOMY  1976  . VARICOSE VEIN SURGERY Bilateral 1999   stab phlebectomy    Family History  Problem Relation Age of Onset  . Hypothyroidism Mother   . Cancer Father 42       stomach  . Stroke Paternal Grandfather   . Stroke Maternal Grandmother   . Diabetes Neg Hx   . CAD Neg Hx   . Breast cancer Neg Hx     Allergies  Allergen Reactions  . Other     PTU: caused hives    Current Outpatient Medications on File Prior to Visit  Medication Sig Dispense Refill  . cetirizine (ZYRTEC) 10 MG tablet Take 10 mg by mouth daily.    Marland Kitchen ibuprofen (ADVIL,MOTRIN) 200 MG tablet Take 400 mg by mouth daily as needed.    Marland Kitchen levothyroxine (SYNTHROID, LEVOTHROID) 75 MCG tablet Take 1 tablet  (75 mcg total) by mouth daily. 90 tablet 3  . Tetrahydrozoline HCl (EYE DROPS OP) Apply 1 drop to eye daily as needed.    . Triamcinolone Acetonide (NASACORT ALLERGY 24HR NA) Place 1 spray into the nose daily as needed.    . vitamin B-12 (CYANOCOBALAMIN) 1000 MCG tablet Take 1 tablet (1,000 mcg total) by mouth daily.     No current facility-administered medications on file prior to visit.     BP 124/72   Pulse (!) 56   Temp 98.2 F (36.8 C) (Oral)   Ht 5' 7.5" (1.715 m)   Wt 236 lb 4 oz (107.2 kg)   SpO2 99%   BMI 36.46 kg/m    Objective:   Physical Exam  Constitutional: She appears well-nourished.  Cardiovascular: Normal rate.  Respiratory: Effort normal.  GI: Soft. Bowel sounds are normal. There is abdominal tenderness in the right lower quadrant, suprapubic area and left lower quadrant.    Skin: Skin is warm and dry.           Assessment & Plan:

## 2018-11-26 NOTE — Patient Instructions (Signed)
Stop by the lab prior to leaving today. I will notify you of your results once received.   Start ciprofloxacin antibiotics for the infection. Take 1 tablet by mouth twice daily for 10 days.  Start metronidazole antibiotics for the infection. Take 1 tablet by mouth three times daily for 10 days.  Please update [place] if you have any problems, especially if you don't notice an improvement in symptoms within 3-4 days.  It was a pleasure meeting you!

## 2019-01-05 ENCOUNTER — Other Ambulatory Visit: Payer: Self-pay | Admitting: Family Medicine

## 2019-01-05 DIAGNOSIS — Z1231 Encounter for screening mammogram for malignant neoplasm of breast: Secondary | ICD-10-CM

## 2019-02-07 ENCOUNTER — Encounter: Payer: Self-pay | Admitting: Family Medicine

## 2019-05-09 ENCOUNTER — Ambulatory Visit
Admission: RE | Admit: 2019-05-09 | Discharge: 2019-05-09 | Disposition: A | Discharge status: Home / Self Care | Payer: 59 | Source: Ambulatory Visit | Attending: Family Medicine | Admitting: Family Medicine

## 2019-05-09 ENCOUNTER — Other Ambulatory Visit: Payer: Self-pay

## 2019-05-09 DIAGNOSIS — Z1231 Encounter for screening mammogram for malignant neoplasm of breast: Secondary | ICD-10-CM

## 2019-05-09 LAB — HM MAMMOGRAPHY

## 2019-05-10 ENCOUNTER — Telehealth: Payer: Self-pay | Admitting: Family Medicine

## 2019-05-10 ENCOUNTER — Encounter: Payer: Self-pay | Admitting: Family Medicine

## 2019-05-10 NOTE — Telephone Encounter (Signed)
Patient is returning call from office about results from scan she had done yesterday at [facility]

## 2019-05-11 NOTE — Telephone Encounter (Signed)
Spoke with pt.  See Imaging Result note, 05/09/19.

## 2019-07-27 ENCOUNTER — Other Ambulatory Visit: Payer: Self-pay | Admitting: Family Medicine

## 2019-09-18 ENCOUNTER — Other Ambulatory Visit: Payer: Self-pay | Admitting: Family Medicine

## 2019-09-18 DIAGNOSIS — E039 Hypothyroidism, unspecified: Secondary | ICD-10-CM

## 2019-09-18 DIAGNOSIS — E785 Hyperlipidemia, unspecified: Secondary | ICD-10-CM

## 2019-09-18 DIAGNOSIS — E538 Deficiency of other specified B group vitamins: Secondary | ICD-10-CM

## 2019-09-18 DIAGNOSIS — E559 Vitamin D deficiency, unspecified: Secondary | ICD-10-CM

## 2019-09-20 ENCOUNTER — Other Ambulatory Visit: Payer: Self-pay

## 2019-09-20 ENCOUNTER — Other Ambulatory Visit (INDEPENDENT_AMBULATORY_CARE_PROVIDER_SITE_OTHER): Payer: 59

## 2019-09-20 DIAGNOSIS — E559 Vitamin D deficiency, unspecified: Secondary | ICD-10-CM | POA: Diagnosis not present

## 2019-09-20 DIAGNOSIS — E039 Hypothyroidism, unspecified: Secondary | ICD-10-CM

## 2019-09-20 DIAGNOSIS — E785 Hyperlipidemia, unspecified: Secondary | ICD-10-CM | POA: Diagnosis not present

## 2019-09-20 DIAGNOSIS — E538 Deficiency of other specified B group vitamins: Secondary | ICD-10-CM

## 2019-09-20 LAB — TSH: TSH: 3.86 u[IU]/mL (ref 0.35–4.50)

## 2019-09-20 LAB — LIPID PANEL
Cholesterol: 196 mg/dL (ref 0–200)
HDL: 59.7 mg/dL (ref >39.00)
LDL Cholesterol: 119 mg/dL — ABNORMAL HIGH (ref 0–99)
NonHDL: 136.76
Total CHOL/HDL Ratio: 3
Triglycerides: 89 mg/dL (ref 0.0–149.0)
VLDL: 17.8 mg/dL (ref 0.0–40.0)

## 2019-09-20 LAB — COMPREHENSIVE METABOLIC PANEL
ALT: 35 U/L (ref 0–35)
AST: 28 U/L (ref 0–37)
Albumin: 4 g/dL (ref 3.5–5.2)
Alkaline Phosphatase: 98 U/L (ref 39–117)
BUN: 9 mg/dL (ref 6–23)
CO2: 29 mEq/L (ref 19–32)
Calcium: 9.3 mg/dL (ref 8.4–10.5)
Chloride: 104 mEq/L (ref 96–112)
Creatinine, Ser: 0.76 mg/dL (ref 0.40–1.20)
GFR: 78.93 mL/min (ref >60.00)
Glucose, Bld: 86 mg/dL (ref 70–99)
Potassium: 4 mEq/L (ref 3.5–5.1)
Sodium: 140 mEq/L (ref 135–145)
Total Bilirubin: 0.7 mg/dL (ref 0.2–1.2)
Total Protein: 6.7 g/dL (ref 6.0–8.3)

## 2019-09-20 LAB — VITAMIN D 25 HYDROXY (VIT D DEFICIENCY, FRACTURES): VITD: 46.02 ng/mL (ref 30.00–100.00)

## 2019-09-20 LAB — VITAMIN B12: Vitamin B-12: 421 pg/mL (ref 211–911)

## 2019-09-26 ENCOUNTER — Encounter: Payer: Self-pay | Admitting: Family Medicine

## 2019-09-26 ENCOUNTER — Ambulatory Visit: Payer: 59 | Admitting: Family Medicine

## 2019-09-26 ENCOUNTER — Other Ambulatory Visit: Payer: Self-pay

## 2019-09-26 ENCOUNTER — Other Ambulatory Visit (HOSPITAL_COMMUNITY)
Admission: RE | Admit: 2019-09-26 | Discharge: 2019-09-26 | Disposition: A | Discharge status: Home / Self Care | Payer: 59 | Source: Ambulatory Visit | Attending: Family Medicine | Admitting: Family Medicine

## 2019-09-26 VITALS — BP 124/74 | HR 61 | Temp 97.5°F | Ht 67.5 in | Wt 223.4 lb

## 2019-09-26 DIAGNOSIS — Z23 Encounter for immunization: Secondary | ICD-10-CM | POA: Diagnosis not present

## 2019-09-26 DIAGNOSIS — E785 Hyperlipidemia, unspecified: Secondary | ICD-10-CM

## 2019-09-26 DIAGNOSIS — Z0000 Encounter for general adult medical examination without abnormal findings: Secondary | ICD-10-CM

## 2019-09-26 DIAGNOSIS — Z01419 Encounter for gynecological examination (general) (routine) without abnormal findings: Secondary | ICD-10-CM | POA: Insufficient documentation

## 2019-09-26 DIAGNOSIS — Z1211 Encounter for screening for malignant neoplasm of colon: Secondary | ICD-10-CM

## 2019-09-26 DIAGNOSIS — E538 Deficiency of other specified B group vitamins: Secondary | ICD-10-CM

## 2019-09-26 DIAGNOSIS — E039 Hypothyroidism, unspecified: Secondary | ICD-10-CM | POA: Diagnosis not present

## 2019-09-26 DIAGNOSIS — E559 Vitamin D deficiency, unspecified: Secondary | ICD-10-CM

## 2019-09-26 DIAGNOSIS — M25562 Pain in left knee: Secondary | ICD-10-CM

## 2019-09-26 DIAGNOSIS — E669 Obesity, unspecified: Secondary | ICD-10-CM

## 2019-09-26 MED ORDER — VITAMIN D 50 MCG (2000 UT) PO CAPS: 1.0000 | ORAL_CAPSULE | Freq: Every day | ORAL | Status: AC

## 2019-09-26 MED ORDER — LEVOTHYROXINE SODIUM 75 MCG PO TABS: 75.0000 ug | ORAL_TABLET | Freq: Every day | ORAL | 3 refills | Status: DC

## 2019-09-26 NOTE — Assessment & Plan Note (Signed)
Continue vit D replacement.  

## 2019-09-26 NOTE — Patient Instructions (Addendum)
Flu shot today.  First shingrix today. Return for nurse visit in 2-6 months to complete series.  We will refer you to GI.  Pap smear performed today. Good to see you, congratulations on weight!  Continue healthy diet and regular exercise. Return as needed or in 1 year for next physical.  Health Maintenance for Postmenopausal Women Menopause is a normal process in which your ability to get pregnant comes to an end. This process happens slowly over many months or years, usually between the ages of 2 and 37. Menopause is complete when you have missed your menstrual periods for 12 months. It is important to talk with your health care provider about some of the most common conditions that affect women after menopause (postmenopausal women). These include heart disease, cancer, and bone loss (osteoporosis). Adopting a healthy lifestyle and getting preventive care can help to promote your health and wellness. The actions you take can also lower your chances of developing some of these common conditions. What should I know about menopause? During menopause, you may get a number of symptoms, such as:  Hot flashes. These can be moderate or severe.  Night sweats.  Decrease in sex drive.  Mood swings.  Headaches.  Tiredness.  Irritability.  Memory problems.  Insomnia. Choosing to treat or not to treat these symptoms is a decision that you make with your health care provider. Do I need hormone replacement therapy?  Hormone replacement therapy is effective in treating symptoms that are caused by menopause, such as hot flashes and night sweats.  Hormone replacement carries certain risks, especially as you become older. If you are thinking about using estrogen or estrogen with progestin, discuss the benefits and risks with your health care provider. What is my risk for heart disease and stroke? The risk of heart disease, heart attack, and stroke increases as you age. One of the causes may be a  change in the body's hormones during menopause. This can affect how your body uses dietary fats, triglycerides, and cholesterol. Heart attack and stroke are medical emergencies. There are many things that you can do to help prevent heart disease and stroke. Watch your blood pressure  High blood pressure causes heart disease and increases the risk of stroke. This is more likely to develop in people who have high blood pressure readings, are of African descent, or are overweight.  Have your blood pressure checked: ? Every 3-5 years if you are 33-57 years of age. ? Every year if you are 64 years old or older. Eat a healthy diet   Eat a diet that includes plenty of vegetables, fruits, low-fat dairy products, and lean protein.  Do not eat a lot of foods that are high in solid fats, added sugars, or sodium. Get regular exercise Get regular exercise. This is one of the most important things you can do for your health. Most adults should:  Try to exercise for at least 150 minutes each week. The exercise should increase your heart rate and make you sweat (moderate-intensity exercise).  Try to do strengthening exercises at least twice each week. Do these in addition to the moderate-intensity exercise.  Spend less time sitting. Even light physical activity can be beneficial. Other tips  Work with your health care provider to achieve or maintain a healthy weight.  Do not use any products that contain nicotine or tobacco, such as cigarettes, e-cigarettes, and chewing tobacco. If you need help quitting, ask your health care provider.  Know your numbers. Ask  your health care provider to check your cholesterol and your blood sugar (glucose). Continue to have your blood tested as directed by your health care provider. Do I need screening for cancer? Depending on your health history and family history, you may need to have cancer screening at different stages of your life. This may include screening  for:  Breast cancer.  Cervical cancer.  Lung cancer.  Colorectal cancer. What is my risk for osteoporosis? After menopause, you may be at increased risk for osteoporosis. Osteoporosis is a condition in which bone destruction happens more quickly than new bone creation. To help prevent osteoporosis or the bone fractures that can happen because of osteoporosis, you may take the following actions:  If you are 12-97 years old, get at least 1,000 mg of calcium and at least 600 mg of vitamin D per day.  If you are older than age 33 but younger than age 67, get at least 1,200 mg of calcium and at least 600 mg of vitamin D per day.  If you are older than age 34, get at least 1,200 mg of calcium and at least 800 mg of vitamin D per day. Smoking and drinking excessive alcohol increase the risk of osteoporosis. Eat foods that are rich in calcium and vitamin D, and do weight-bearing exercises several times each week as directed by your health care provider. How does menopause affect my mental health? Depression may occur at any age, but it is more common as you become older. Common symptoms of depression include:  Low or sad mood.  Changes in sleep patterns.  Changes in appetite or eating patterns.  Feeling an overall lack of motivation or enjoyment of activities that you previously enjoyed.  Frequent crying spells. Talk with your health care provider if you think that you are experiencing depression. General instructions See your health care provider for regular wellness exams and vaccines. This may include:  Scheduling regular health, dental, and eye exams.  Getting and maintaining your vaccines. These include: ? Influenza vaccine. Get this vaccine each year before the flu season begins. ? Pneumonia vaccine. ? Shingles vaccine. ? Tetanus, diphtheria, and pertussis (Tdap) booster vaccine. Your health care provider may also recommend other immunizations. Tell your health care provider  if you have ever been abused or do not feel safe at home. Summary  Menopause is a normal process in which your ability to get pregnant comes to an end.  This condition causes hot flashes, night sweats, decreased interest in sex, mood swings, headaches, or lack of sleep.  Treatment for this condition may include hormone replacement therapy.  Take actions to keep yourself healthy, including exercising regularly, eating a healthy diet, watching your weight, and checking your blood pressure and blood sugar levels.  Get screened for cancer and depression. Make sure that you are up to date with all your vaccines. This information is not intended to replace advice given to you by your health care provider. Make sure you discuss any questions you have with your health care provider. Document Released: 11/14/2005 Document Revised: 09/15/2018 Document Reviewed: 09/15/2018 Elsevier Patient Education  2020 [employer].

## 2019-09-26 NOTE — Assessment & Plan Note (Signed)
Chronic, stable on current regimen, refilled x 1 year.

## 2019-09-26 NOTE — Assessment & Plan Note (Signed)
Continue b12 replacement.  

## 2019-09-26 NOTE — Progress Notes (Signed)
This visit was conducted in person.  BP 124/74 (BP Location: Left Arm, Patient Position: Sitting, Cuff Size: Large)   Pulse 61   Temp (!) 97.5 F (36.4 C) (Temporal)   Ht 5' 7.5" (1.715 m)   Wt 223 lb 7 oz (101.4 kg)   SpO2 97%   BMI 34.48 kg/m    CC: CPE Subjective:    Patient ID: Kirsten Barnes, female    DOB: Oct 12, 1965, 53 y.o.   MRN: [id number]  HPI: Kirsten Barnes is a 53 y.o. female presenting on 09/26/2019 for Annual Exam   Weight loss noted! Joined weight watchers this past month.   Allergic rhinitis - on nasacort and zyrtec. Occasional nosebleeds.  Hypothyroidism - no hyper or hypothyroid symptoms.   Noticing some grinding of neck with movement, no pain. Also with ongoing L knee pain but improving with regular exercise on bicycle.   Preventative: COLONOSCOPY 2015 polyps, rpt 5 yrs (Dr Harley Hallmark). Would like local referral in [place]. Well woman with pap 2015- never abnormal. Desires to space out to 5 yrs.  Mammo 05/2019 Birads1. Does breast exams at home without concerns. G2P2 LMP ~2012 menopause at age 34 Flu shot - agrees today  Tdap 09/2016 shingrix - discussed, would like to start.  Seat belt use discussed Sunscreen use discussed. No changing moles on skin. Non smoker  Alcohol - 2 glass of wine with dinner regularly  Dentist - Q6 mo  Eye exam yearly   From [place] with husband  [id number], housewife  Activity: gardening, golf, bought electrical bike and enjoys this.  Diet: good water, fruits/vegetables daily, enjoys cooking, recently joined [employer]     Relevant past medical, surgical, family and social history reviewed and updated as indicated. Interim medical history since our last visit reviewed. Allergies and medications reviewed and updated. Outpatient Medications Prior to Visit  Medication Sig Dispense Refill  . cetirizine (ZYRTEC) 10 MG tablet Take 10 mg by mouth daily.    Marland Kitchen ibuprofen (ADVIL,MOTRIN) 200 MG tablet  Take 400 mg by mouth daily as needed.    . Tetrahydrozoline HCl (EYE DROPS OP) Apply 1 drop to eye daily as needed.    . Triamcinolone Acetonide (NASACORT ALLERGY 24HR NA) Place 1 spray into the nose daily as needed.    . vitamin B-12 (CYANOCOBALAMIN) 1000 MCG tablet Take 1 tablet (1,000 mcg total) by mouth daily.    Marland Kitchen levothyroxine (SYNTHROID) 75 MCG tablet TAKE 1 TABLET BY MOUTH  DAILY 90 tablet 0  . ciprofloxacin (CIPRO) 500 MG tablet Take 1 tablet (500 mg total) by mouth 2 (two) times daily. 20 tablet 0   No facility-administered medications prior to visit.     Per HPI unless specifically indicated in ROS section below Review of Systems  Constitutional: Negative for activity change, appetite change, chills, fatigue, fever and unexpected weight change.  HENT: Negative for hearing loss.   Eyes: Negative for visual disturbance.  Respiratory: Negative for cough, chest tightness, shortness of breath and wheezing.   Cardiovascular: Negative for chest pain, palpitations and leg swelling.  Gastrointestinal: Negative for abdominal distention, abdominal pain, blood in stool, constipation, diarrhea, nausea and vomiting.  Genitourinary: Negative for difficulty urinating and hematuria.  Musculoskeletal: Negative for arthralgias, myalgias and neck pain.  Skin: Negative for rash.  Neurological: Negative for dizziness, seizures, syncope and headaches.  Hematological: Negative for adenopathy. Does not bruise/bleed easily.  Psychiatric/Behavioral: Negative for dysphoric mood. The patient is not nervous/anxious.    Objective:  BP 124/74 (BP Location: Left Arm, Patient Position: Sitting, Cuff Size: Large)   Pulse 61   Temp (!) 97.5 F (36.4 C) (Temporal)   Ht 5' 7.5" (1.715 m)   Wt 223 lb 7 oz (101.4 kg)   SpO2 97%   BMI 34.48 kg/m   Wt Readings from Last 3 Encounters:  09/26/19 223 lb 7 oz (101.4 kg)  11/26/18 236 lb 4 oz (107.2 kg)  09/20/18 233 lb 4 oz (105.8 kg)    Physical Exam  Vitals and nursing note reviewed. Exam conducted with a chaperone present.  Constitutional:      General: She is not in acute distress.    Appearance: Normal appearance. She is well-developed. She is obese. She is not ill-appearing.  HENT:     Head: Normocephalic and atraumatic.     Right Ear: Hearing, tympanic membrane, ear canal and external ear normal.     Left Ear: Hearing, tympanic membrane, ear canal and external ear normal.     Nose: Nose normal.     Mouth/Throat:     Pharynx: Uvula midline.  Eyes:     General: No scleral icterus.    Extraocular Movements: Extraocular movements intact.     Conjunctiva/sclera: Conjunctivae normal.     Pupils: Pupils are equal, round, and reactive to light.  Cardiovascular:     Rate and Rhythm: Normal rate and regular rhythm.     Pulses: Normal pulses.          Radial pulses are 2+ on the right side and 2+ on the left side.     Heart sounds: Normal heart sounds. No murmur.  Pulmonary:     Effort: Pulmonary effort is normal. No respiratory distress.     Breath sounds: Normal breath sounds. No wheezing, rhonchi or rales.  Abdominal:     General: Abdomen is flat. Bowel sounds are normal. There is no distension.     Palpations: Abdomen is soft. There is no mass.     Tenderness: There is no abdominal tenderness. There is no guarding or rebound.     Hernia: No hernia is present.  Genitourinary:    Exam position: Supine.     Pubic Area: No rash.      Labia:        Right: No rash, tenderness or lesion.        Left: No rash, tenderness or lesion.      Urethra: No prolapse.     Vagina: Normal.     Cervix: Normal.     Uterus: Normal.      Adnexa: Right adnexa normal and left adnexa normal.     Comments: Pap performed on cervix Musculoskeletal:        General: Normal range of motion.     Cervical back: Normal range of motion and neck supple.     Right lower leg: No edema.     Left lower leg: No edema.  Lymphadenopathy:     Cervical: No  cervical adenopathy.  Skin:    General: Skin is warm and dry.     Findings: No rash.  Neurological:     General: No focal deficit present.     Mental Status: She is alert and oriented to person, place, and time.     Comments: CN grossly intact, station and gait intact  Psychiatric:        Mood and Affect: Mood normal.        Behavior: Behavior normal.  Thought Content: Thought content normal.        Judgment: Judgment normal.       Results for orders placed or performed in visit on 09/20/19  vit d  Result Value Ref Range   VITD 46.02 30.00 - 100.00 ng/mL  Vitamin B12  Result Value Ref Range   Vitamin B-12 421 211 - 911 pg/mL  TSH  Result Value Ref Range   TSH 3.86 0.35 - 4.50 uIU/mL  Comprehensive metabolic panel  Result Value Ref Range   Sodium 140 135 - 145 mEq/L   Potassium 4.0 3.5 - 5.1 mEq/L   Chloride 104 96 - 112 mEq/L   CO2 29 19 - 32 mEq/L   Glucose, Bld 86 70 - 99 mg/dL   BUN 9 6 - 23 mg/dL   Creatinine, Ser 0.76 0.40 - 1.20 mg/dL   Total Bilirubin 0.7 0.2 - 1.2 mg/dL   Alkaline Phosphatase 98 39 - 117 U/L   AST 28 0 - 37 U/L   ALT 35 0 - 35 U/L   Total Protein 6.7 6.0 - 8.3 g/dL   Albumin 4.0 3.5 - 5.2 g/dL   GFR 78.93 >60.00 mL/min   Calcium 9.3 8.4 - 10.5 mg/dL  Lipid panel  Result Value Ref Range   Cholesterol 196 0 - 200 mg/dL   Triglycerides 89.0 0.0 - 149.0 mg/dL   HDL 59.70 >39.00 mg/dL   VLDL 17.8 0.0 - 40.0 mg/dL   LDL Cholesterol 119 (H) 0 - 99 mg/dL   Total CHOL/HDL Ratio 3    NonHDL 136.76    Assessment & Plan:  This visit occurred during the SARS-CoV-2 public health emergency.  Safety protocols were in place, including screening questions prior to the visit, additional usage of staff PPE, and extensive cleaning of exam room while observing appropriate contact time as indicated for disinfecting solutions.   Problem List Items Addressed This Visit    Vitamin D deficiency    Continue vit D replacement      Vitamin B12 deficiency     Continue b12 replacement.       Obesity, Class I, BMI 30-34.9    Congratulated on weight loss to date. Pt motivated to continue healthy diet and lifestyle changes to affect sustainable weight loss.       Left anterior knee pain    Discussed trial glucosamine       Hypothyroidism    Chronic, stable on current regimen, refilled x 1 year.       Relevant Medications   levothyroxine (SYNTHROID) 75 MCG tablet   Health maintenance examination - Primary    Preventative protocols reviewed and updated unless pt declined. Discussed healthy diet and lifestyle.       Dyslipidemia    Chronic, stable off meds. The 10-year ASCVD risk score Mikey Bussing DC Brooke Bonito., et al., 2013) is: 1.7%   Values used to calculate the score:     Age: 89 years     Sex: Female     Is Non-Hispanic African American: No     Diabetic: No     Tobacco smoker: No     Systolic Blood Pressure: [id number] mmHg     Is BP treated: No     HDL Cholesterol: 59.7 mg/dL     Total Cholesterol: 196 mg/dL        Other Visit Diagnoses    Special screening for malignant neoplasms, colon       Relevant Orders   Ambulatory referral to Gastroenterology  Need for influenza vaccination       Relevant Orders   Flu Vaccine QUAD 36+ mos IM (Completed)   Need for shingles vaccine       Relevant Orders   Varicella-zoster vaccine IM (Completed)   Encounter for annual routine gynecological examination       Relevant Orders   Cytology - PAP       Meds ordered this encounter  Medications  . levothyroxine (SYNTHROID) 75 MCG tablet    Sig: Take 1 tablet (75 mcg total) by mouth daily.    Dispense:  90 tablet    Refill:  3  . Cholecalciferol (VITAMIN D) 50 MCG (2000 UT) CAPS    Sig: Take 1 capsule (2,000 Units total) by mouth daily.    Dispense:  30 capsule   Orders Placed This Encounter  Procedures  . Flu Vaccine QUAD 36+ mos IM  . Varicella-zoster vaccine IM  . Ambulatory referral to Gastroenterology    Referral Priority:   Routine     Referral Type:   Consultation    Referral Reason:   Specialty Services Required    Number of Visits Requested:   1    Patient instructions: Flu shot today.  First shingrix today. Return for nurse visit in 2-6 months to complete series.  We will refer you to GI.  Pap smear performed today. Good to see you, congratulations on weight!  Continue healthy diet and regular exercise. Return as needed or in 1 year for next physical.  Follow up plan: Return in about 1 year (around 09/25/2020) for annual exam, prior fasting for blood work.  Ria Bush, MD

## 2019-09-26 NOTE — Assessment & Plan Note (Signed)
Chronic, stable off meds. The 10-year ASCVD risk score Mikey Bussing DC Brooke Bonito., et al., 2013) is: 1.7%   Values used to calculate the score:     Age: 53 years     Sex: Female     Is Non-Hispanic African American: No     Diabetic: No     Tobacco smoker: No     Systolic Blood Pressure: A999333 mmHg     Is BP treated: No     HDL Cholesterol: 59.7 mg/dL     Total Cholesterol: 196 mg/dL

## 2019-09-26 NOTE — Assessment & Plan Note (Signed)
Preventative protocols reviewed and updated unless pt declined. Discussed healthy diet and lifestyle.  

## 2019-09-26 NOTE — Assessment & Plan Note (Signed)
Discussed trial glucosamine

## 2019-09-26 NOTE — Assessment & Plan Note (Signed)
Congratulated on weight loss to date. Pt motivated to continue healthy diet and lifestyle changes to affect sustainable weight loss.

## 2019-09-28 LAB — CYTOLOGY - PAP
Comment: NEGATIVE
Diagnosis: NEGATIVE
High risk HPV: NEGATIVE

## 2019-10-03 ENCOUNTER — Telehealth: Payer: Self-pay

## 2019-10-03 ENCOUNTER — Other Ambulatory Visit: Payer: Self-pay

## 2019-10-03 DIAGNOSIS — Z86010 Personal history of colonic polyps: Secondary | ICD-10-CM

## 2019-10-03 DIAGNOSIS — Z1211 Encounter for screening for malignant neoplasm of colon: Secondary | ICD-10-CM

## 2019-10-03 MED ORDER — NA SULFATE-K SULFATE-MG SULF 17.5-3.13-1.6 GM/177ML PO SOLN: 1.0000 | Freq: Once | ORAL | 0 refills | Status: AC

## 2019-10-03 NOTE — Telephone Encounter (Signed)
Gastroenterology Pre-Procedure Review  Request Date: 10/10/19 Requesting Physician: Dr. Vicente Males  PATIENT REVIEW QUESTIONS: The patient responded to the following health history questions as indicated:    1. Are you having any GI issues? no 2. Do you have a personal history of Polyps? YES 2015 3. Do you have a family history of Colon Cancer or Polyps? FATHER HAD STOMACH CANCER 4. Diabetes Mellitus? no 5. Joint replacements in the past 12 months?no 6. Major health problems in the past 3 months?no 7. Any artificial heart valves, MVP, or defibrillator?no    MEDICATIONS & ALLERGIES:    Patient reports the following regarding taking any anticoagulation/antiplatelet therapy:   Plavix, Coumadin, Eliquis, Xarelto, Lovenox, Pradaxa, Brilinta, or Effient? no Aspirin? no  Patient confirms/reports the following medications:  Current Outpatient Medications  Medication Sig Dispense Refill  . cetirizine (ZYRTEC) 10 MG tablet Take 10 mg by mouth daily.    . Cholecalciferol (VITAMIN D) 50 MCG (2000 UT) CAPS Take 1 capsule (2,000 Units total) by mouth daily. 30 capsule   . ibuprofen (ADVIL,MOTRIN) 200 MG tablet Take 400 mg by mouth daily as needed.    Marland Kitchen levothyroxine (SYNTHROID) 75 MCG tablet Take 1 tablet (75 mcg total) by mouth daily. 90 tablet 3  . Na Sulfate-K Sulfate-Mg Sulf 17.5-3.13-1.6 GM/177ML SOLN Take 1 kit by mouth once for 1 dose. 354 mL 0  . Tetrahydrozoline HCl (EYE DROPS OP) Apply 1 drop to eye daily as needed.    . Triamcinolone Acetonide (NASACORT ALLERGY 24HR NA) Place 1 spray into the nose daily as needed.    . vitamin B-12 (CYANOCOBALAMIN) 1000 MCG tablet Take 1 tablet (1,000 mcg total) by mouth daily.     No current facility-administered medications for this visit.    Patient confirms/reports the following allergies:  Allergies  Allergen Reactions  . Other     PTU: caused hives    No orders of the defined types were placed in this encounter.   AUTHORIZATION  INFORMATION Primary Insurance: 1D#: Group #:  Secondary Insurance: 1D#: Group #:  SCHEDULE INFORMATION: Date: 10/10/19 Time: Location:[facility]

## 2019-10-03 NOTE — Telephone Encounter (Signed)
Colonoscopy rescheduled from the 4th to 5th with Dr. Vicente Males.  New instructions sent to patient via mychart. Informed of COVID test to be done on Thursday instead of Wed 12/30.  Thanks [employer]

## 2019-10-06 ENCOUNTER — Other Ambulatory Visit: Payer: Self-pay

## 2019-10-06 ENCOUNTER — Other Ambulatory Visit
Admission: RE | Admit: 2019-10-06 | Discharge: 2019-10-06 | Disposition: A | Discharge status: Home / Self Care | Payer: 59 | Source: Ambulatory Visit | Attending: Gastroenterology | Admitting: Gastroenterology

## 2019-10-06 DIAGNOSIS — Z01812 Encounter for preprocedural laboratory examination: Secondary | ICD-10-CM | POA: Diagnosis not present

## 2019-10-06 DIAGNOSIS — Z20828 Contact with and (suspected) exposure to other viral communicable diseases: Secondary | ICD-10-CM | POA: Insufficient documentation

## 2019-10-07 LAB — SARS CORONAVIRUS 2 (TAT 6-24 HRS): SARS Coronavirus 2: NEGATIVE

## 2019-10-10 ENCOUNTER — Encounter: Payer: Self-pay | Admitting: Gastroenterology

## 2019-10-11 ENCOUNTER — Ambulatory Visit
Admission: RE | Admit: 2019-10-11 | Discharge: 2019-10-11 | Disposition: A | Discharge status: Home / Self Care | Payer: 59 | Attending: Gastroenterology | Admitting: Gastroenterology

## 2019-10-11 ENCOUNTER — Encounter
Admission: RE | Disposition: A | Discharge status: Home / Self Care | Payer: Self-pay | Source: Home / Self Care | Attending: Gastroenterology

## 2019-10-11 ENCOUNTER — Ambulatory Visit: Payer: 59 | Admitting: Certified Registered Nurse Anesthetist

## 2019-10-11 ENCOUNTER — Encounter: Payer: Self-pay | Admitting: Gastroenterology

## 2019-10-11 DIAGNOSIS — K635 Polyp of colon: Secondary | ICD-10-CM | POA: Diagnosis not present

## 2019-10-11 DIAGNOSIS — D120 Benign neoplasm of cecum: Secondary | ICD-10-CM | POA: Insufficient documentation

## 2019-10-11 DIAGNOSIS — K5730 Diverticulosis of large intestine without perforation or abscess without bleeding: Secondary | ICD-10-CM | POA: Insufficient documentation

## 2019-10-11 DIAGNOSIS — Z86010 Personal history of colonic polyps: Secondary | ICD-10-CM | POA: Diagnosis not present

## 2019-10-11 DIAGNOSIS — D122 Benign neoplasm of ascending colon: Secondary | ICD-10-CM | POA: Diagnosis not present

## 2019-10-11 DIAGNOSIS — Z1211 Encounter for screening for malignant neoplasm of colon: Secondary | ICD-10-CM | POA: Diagnosis not present

## 2019-10-11 HISTORY — PX: COLONOSCOPY WITH PROPOFOL: SHX5780

## 2019-10-11 SURGERY — COLONOSCOPY WITH PROPOFOL
Anesthesia: General

## 2019-10-11 MED ORDER — PROPOFOL 10 MG/ML IV BOLUS
INTRAVENOUS | Status: DC | PRN
  Administered 2019-10-11: 20 mg via INTRAVENOUS
  Administered 2019-10-11: 125 ug/kg/min via INTRAVENOUS
  Administered 2019-10-11: 50 mg via INTRAVENOUS

## 2019-10-11 MED ORDER — LIDOCAINE HCL (CARDIAC) PF 100 MG/5ML IV SOSY
PREFILLED_SYRINGE | INTRAVENOUS | Status: DC | PRN
  Administered 2019-10-11: 50 mg via INTRAVENOUS

## 2019-10-11 MED ORDER — SODIUM CHLORIDE 0.9 % IV SOLN
INTRAVENOUS | Status: DC
  Administered 2019-10-11: 10:00:00 1000 mL via INTRAVENOUS

## 2019-10-11 NOTE — H&P (Signed)
Jonathon Bellows, MD [address] [place], [place], [place], [address] [address], [place], [place], [place], [address] Phone: [phone]  Fax: [phone]  Primary Care Physician:  Ria Bush, MD   Pre-Procedure History & Physical: HPI:  Kirsten Barnes is a 54 y.o. female is here for an colonoscopy.   Past Medical History:  Diagnosis Date  . Cardiac failure left ([place])    due to overactive thyroid  . History of chickenpox   . History of colon polyps   . History of diverticulitis   . Hypothyroidism    initial hyper    Past Surgical History:  Procedure Laterality Date  . CHOLECYSTECTOMY    . CHOLECYSTECTOMY, LAPAROSCOPIC  2008  . COLONOSCOPY  2015   HP, TA polyps, rpt 5 yrs (Dr Julien Nordmann)  . DIAGNOSTIC LAPAROSCOPY    . PLANTAR FASCIA SURGERY  2011   with a spur removed  . TONSILLECTOMY     WITH ADENOIDECTOMY  . TONSILLECTOMY AND ADENOIDECTOMY  1976  . VARICOSE VEIN SURGERY Bilateral 1999   stab phlebectomy    Prior to Admission medications   Medication Sig Start Date End Date Taking? Authorizing Provider  levothyroxine (SYNTHROID) 75 MCG tablet Take 1 tablet (75 mcg total) by mouth daily. 09/26/19  Yes Ria Bush, MD  cetirizine (ZYRTEC) 10 MG tablet Take 10 mg by mouth daily.    [provider]  Cholecalciferol (VITAMIN D) 50 MCG (2000 UT) CAPS Take 1 capsule (2,000 Units total) by mouth daily. 09/26/19   Ria Bush, MD  ibuprofen (ADVIL,MOTRIN) 200 MG tablet Take 400 mg by mouth daily as needed.    [provider]  Tetrahydrozoline HCl (EYE DROPS OP) Apply 1 drop to eye daily as needed.    [provider]  Triamcinolone Acetonide (NASACORT ALLERGY 24HR NA) Place 1 spray into the nose daily as needed.    [provider]  vitamin B-12 (CYANOCOBALAMIN) 1000 MCG tablet Take 1 tablet (1,000 mcg total) by mouth daily. 09/19/17   Ria Bush, MD    Allergies as of 10/03/2019 - Review Complete 09/26/2019    Allergen Reaction Noted  . Other  01/05/2017    Family History  Problem Relation Age of Onset  . Hypothyroidism Mother   . Cancer Father 38       stomach  . Stroke Paternal Grandfather   . Stroke Maternal Grandmother   . Diabetes Neg Hx   . CAD Neg Hx   . Breast cancer Neg Hx     Social History   Socioeconomic History  . Marital status: Married    Spouse name: Not on file  . Number of children: Not on file  . Years of education: Not on file  . Highest education level: Not on file  Occupational History  . Not on file  Tobacco Use  . Smoking status: Never Smoker  . Smokeless tobacco: Never Used  Substance and Sexual Activity  . Alcohol use: Yes    Comment: glass of wine with dinner (daily)  . Drug use: No  . Sexual activity: Not on file  Other Topics Concern  . Not on file  Social History Narrative   From [place]    Lives with husband    Occ: Marine scientist, housewife    Activity: gardening, golf. Enjoys sewing   Diet: good water, fruits/vegetables daily, enjoys cooking   Social Determinants of Health   Financial Resource Strain:   . Difficulty of Paying Living Expenses: Not on file  Food  Insecurity:   . Worried About Charity fundraiser in the Last Year: Not on file  . Ran Out of Food in the Last Year: Not on file  Transportation Needs:   . Lack of Transportation (Medical): Not on file  . Lack of Transportation (Non-Medical): Not on file  Physical Activity:   . Days of Exercise per Week: Not on file  . Minutes of Exercise per Session: Not on file  Stress:   . Feeling of Stress : Not on file  Social Connections:   . Frequency of Communication with Friends and Family: Not on file  . Frequency of Social Gatherings with Friends and Family: Not on file  . Attends Religious Services: Not on file  . Active Member of Clubs or Organizations: Not on file  . Attends Archivist Meetings: Not on file  . Marital Status: Not on file  Intimate Partner Violence:   .  Fear of Current or Ex-Partner: Not on file  . Emotionally Abused: Not on file  . Physically Abused: Not on file  . Sexually Abused: Not on file    Review of Systems: See HPI, otherwise negative ROS  Physical Exam: BP (!) 144/92   Pulse 89   Temp (!) 97.3 F (36.3 C) (Skin)   Resp 16   Ht 5' 7.5" (1.715 m)   Wt 97.5 kg   SpO2 98%   BMI 33.18 kg/m  General:   Alert,  pleasant and cooperative in NAD Head:  Normocephalic and atraumatic. Neck:  Supple; no masses or thyromegaly. Lungs:  Clear throughout to auscultation, normal respiratory effort.    Heart:  +S1, +S2, Regular rate and rhythm, No edema. Abdomen:  Soft, nontender and nondistended. Normal bowel sounds, without guarding, and without rebound.   Neurologic:  Alert and  oriented x4;  grossly normal neurologically.  Impression/Plan: Kirsten Barnes is here for an colonoscopy to be performed for surveillance due to prior history of colon polyps .Last colonoscopy 5 yars back   Risks, benefits, limitations, and alternatives regarding  colonoscopy have been reviewed with the patient.  Questions have been answered.  All parties agreeable.   Jonathon Bellows, MD  10/11/2019, 10:05 AM

## 2019-10-11 NOTE — Transfer of Care (Signed)
Immediate Anesthesia Transfer of Care Note  Patient: Kirsten Barnes  Procedure(s) Performed: COLONOSCOPY WITH PROPOFOL (N/A )  Patient Location: PACU  Anesthesia Type:General  Level of Consciousness: awake, alert  and oriented  Airway & Oxygen Therapy: Patient Spontanous Breathing  Post-op Assessment: Report given to RN and Post -op Vital signs reviewed and stable  Post vital signs: Reviewed and stable  Last Vitals:  Vitals Value Taken Time  BP    Temp    Pulse 56 10/11/19 1043  Resp 16 10/11/19 1043  SpO2 99 % 10/11/19 1043  Vitals shown include unvalidated device data.  Last Pain:  Vitals:   10/11/19 0935  TempSrc: Skin  PainSc: 0-No pain         Complications: No apparent anesthesia complications

## 2019-10-11 NOTE — Op Note (Signed)
[facility] Gastroenterology Patient Name: Kirsten Barnes Procedure Date: 10/11/2019 9:34 AM MRN: [id number] Account #: [id number] Date of Birth: 03/03/1966 Admit Type: Outpatient Age: 54 Room: [facility] ENDO ROOM 1 Gender: Female Note Status: Finalized Procedure:             Colonoscopy Indications:           High risk colon cancer surveillance: Personal history                         of colonic polyps Providers:             Jonathon Bellows MD, MD Medicines:             Monitored Anesthesia Care Complications:         No immediate complications. Procedure:             Pre-Anesthesia Assessment:                        - Prior to the procedure, a History and Physical was                         performed, and patient medications, allergies and                         sensitivities were reviewed. The patient's tolerance                         of previous anesthesia was reviewed.                        - The risks and benefits of the procedure and the                         sedation options and risks were discussed with the                         patient. All questions were answered and informed                         consent was obtained.                        - ASA Grade Assessment: II - A patient with mild                         systemic disease.                        After obtaining informed consent, the colonoscope was                         passed under direct vision. Throughout the procedure,                         the patient's blood pressure, pulse, and oxygen                         saturations were monitored continuously. The  Colonoscope was introduced through the anus and                         advanced to the the cecum, identified by the                         appendiceal orifice. The colonoscopy was performed                         with ease. The patient tolerated the procedure well.                         The quality of the  bowel preparation was good. Findings:      The perianal and digital rectal examinations were normal.      Multiple small-mouthed diverticula were found in the sigmoid colon.      A 8 mm polyp was found in the cecum. The polyp was sessile. The polyp       was removed with a cold snare. Resection and retrieval were complete.      A 12 mm polyp was found in the proximal ascending colon. The polyp was       sessile. The polyp was removed with a piecemeal technique using a cold       snare. Resection and retrieval were complete. To prevent bleeding after       the polypectomy, two hemostatic clips were successfully placed. There       was no bleeding during, or at the end, of the procedure.      The exam was otherwise without abnormality on direct and retroflexion       views. Impression:            - Diverticulosis in the sigmoid colon.                        - One 8 mm polyp in the cecum, removed with a cold                         snare. Resected and retrieved.                        - One 12 mm polyp in the proximal ascending colon,                         removed piecemeal using a cold snare. Resected and                         retrieved. Clips were placed.                        - The examination was otherwise normal on direct and                         retroflexion views. Recommendation:        - Discharge patient to home (with escort).                        - Resume previous diet.                        -  Continue present medications.                        - Await pathology results.                        - Repeat colonoscopy in 6 months for surveillance                         after piecemeal polypectomy. Procedure Code(s):     --- Professional ---                        [phone], Colonoscopy, flexible; with removal of                         tumor(s), polyp(s), or other lesion(s) by snare                         technique Diagnosis Code(s):     --- Professional ---                         Z86.010, Personal history of colonic polyps                        K63.5, Polyp of colon                        K57.30, Diverticulosis of large intestine without                         perforation or abscess without bleeding CPT copyright 2019 American Medical Association. All rights reserved. The codes documented in this report are preliminary and upon coder review may  be revised to meet current compliance requirements. Jonathon Bellows, MD Jonathon Bellows MD, MD 10/11/2019 10:43:11 AM This report has been signed electronically. Number of Addenda: 0 Note Initiated On: 10/11/2019 9:34 AM Scope Withdrawal Time: 0 hours 23 minutes 44 seconds  Total Procedure Duration: 0 hours 28 minutes 27 seconds  Estimated Blood Loss:  Estimated blood loss: none.      [facility]

## 2019-10-11 NOTE — Anesthesia Preprocedure Evaluation (Signed)
Anesthesia Evaluation  Patient identified by MRN, date of birth, ID band Patient awake    Reviewed: Allergy & Precautions, NPO status , Patient's Chart, lab work & pertinent test results  History of Anesthesia Complications (+) PONVNegative for: history of anesthetic complications  Airway Mallampati: II       Dental   Pulmonary neg sleep apnea, neg COPD, Not current smoker,           Cardiovascular (-) hypertension(-) Past MI and (-) CHF (-) dysrhythmias (-) Valvular Problems/Murmurs     Neuro/Psych neg Seizures    GI/Hepatic Neg liver ROS, neg GERD  ,  Endo/Other  neg diabetesHypothyroidism   Renal/GU negative Renal ROS     Musculoskeletal   Abdominal   Peds  Hematology   Anesthesia Other Findings   Reproductive/Obstetrics                             Anesthesia Physical Anesthesia Plan  ASA: II  Anesthesia Plan: General   Post-op Pain Management:    Induction: Intravenous  PONV Risk Score and Plan: 4 or greater and Propofol infusion, TIVA, Ondansetron and Treatment may vary due to age or medical condition  Airway Management Planned: Nasal Cannula  Additional Equipment:   Intra-op Plan:   Post-operative Plan:   Informed Consent: I have reviewed the patients History and Physical, chart, labs and discussed the procedure including the risks, benefits and alternatives for the proposed anesthesia with the patient or authorized representative who has indicated his/her understanding and acceptance.       Plan Discussed with:   Anesthesia Plan Comments:         Anesthesia Quick Evaluation

## 2019-10-11 NOTE — Anesthesia Postprocedure Evaluation (Signed)
Anesthesia Post Note  Patient: Kirsten Barnes  Procedure(s) Performed: COLONOSCOPY WITH PROPOFOL (N/A )  Patient location during evaluation: Endoscopy Anesthesia Type: General Level of consciousness: awake and alert Pain management: pain level controlled Vital Signs Assessment: post-procedure vital signs reviewed and stable Respiratory status: spontaneous breathing and respiratory function stable Cardiovascular status: stable Anesthetic complications: no     Last Vitals:  Vitals:   10/11/19 0935 10/11/19 1040  BP: (!) 144/92 (!) 87/46  Pulse: 89 (!) 57  Resp: 16 17  Temp: (!) 36.3 C (!) 36.3 C  SpO2: 98% 99%    Last Pain:  Vitals:   10/11/19 1040  TempSrc: Temporal  PainSc:                  Kirsten Barnes

## 2019-10-12 ENCOUNTER — Encounter: Payer: Self-pay | Admitting: *Deleted

## 2019-10-12 LAB — SURGICAL PATHOLOGY

## 2019-10-14 ENCOUNTER — Encounter: Payer: Self-pay | Admitting: Gastroenterology

## 2019-10-15 ENCOUNTER — Encounter: Payer: Self-pay | Admitting: Family Medicine

## 2019-12-27 ENCOUNTER — Ambulatory Visit: Payer: 59

## 2019-12-28 ENCOUNTER — Other Ambulatory Visit: Payer: Self-pay

## 2019-12-28 ENCOUNTER — Encounter: Payer: Self-pay | Admitting: Family Medicine

## 2019-12-28 ENCOUNTER — Ambulatory Visit: Payer: 59 | Admitting: Family Medicine

## 2019-12-28 VITALS — BP 120/80 | HR 64 | Temp 97.7°F | Ht 67.5 in | Wt 219.6 lb

## 2019-12-28 DIAGNOSIS — R220 Localized swelling, mass and lump, head: Secondary | ICD-10-CM

## 2019-12-28 DIAGNOSIS — R040 Epistaxis: Secondary | ICD-10-CM

## 2019-12-28 MED ORDER — DOXYCYCLINE HYCLATE 100 MG PO TABS: 100.0000 mg | ORAL_TABLET | Freq: Two times a day (BID) | ORAL | 0 refills | Status: DC

## 2019-12-28 MED ORDER — PREDNISONE 20 MG PO TABS: ORAL_TABLET | ORAL | 0 refills | Status: DC

## 2019-12-28 NOTE — Assessment & Plan Note (Addendum)
Most consistent with localized allergic reaction to bug bite. No signs of anaphylaxis. Unsure what bit her. Has reacted like this in the past.  Treat with prednisone taper. Continue benadryl and zyrtec.  No other new exposure.  Knows to start using insect repellent.  Consider allergist evaluation.  Denies exposure to [place] although she reacts similarly to this.  Presumed bites that broke skin barrier so at risk for infection - provided with WASP for doxy course with instructions when to fill.

## 2019-12-28 NOTE — Assessment & Plan Note (Addendum)
Recurrent nosebleeds initially worse with nasacort use, now persistent despite stopping INS. Discussed regular nasal saline irrigation. Reviewed allergen avoidance measures besides INS.

## 2019-12-28 NOTE — Progress Notes (Signed)
This visit was conducted in person.  BP 120/80 (BP Location: Left Arm, Patient Position: Sitting, Cuff Size: Normal)   Pulse 64   Temp 97.7 F (36.5 C) (Temporal)   Ht 5' 7.5" (1.715 m)   Wt 219 lb 9.6 oz (99.6 kg)   SpO2 100%   BMI 33.89 kg/m    CC: facial swelling Subjective:    Patient ID: Kirsten Barnes, female    DOB: 08-21-1966, 54 y.o.   MRN: [id number]  HPI: Kirsten Barnes is a 54 y.o. female presenting on 12/28/2019 for Facial Swelling (Pt c/o eye and face swelling x 2 days.  Pt thinks she was bitten,  lt eye swelling and rt neck area below ear.  Pt has been taking Benadryl)   Out working in the yard on Monday - that night she felt itching of the skin. Yesterday morning awoke with L swollen eye. Again worse this morning, spreading to right eye. Doesn't remember any bites or stings. Itching > pain. No exposure to poison ivy. She thinks she was bitten by a bug.   2 wks ago she was also bitten bilateral neck while in the back yard as well, had swollen red neck after that reaction.  Known hol[place] allergy - but has not had recent exposure   Tried benadryl and zyrtec for this. Using cortisone cream as well.   No fevers/chills, no pain with eye movements, no red eye, no congestion, no earache. No abd pain, nausea, no dizziness, no tongue/lip swelling or throat swelling.   She had a bad L side nosebleed this morning - h/o nosebleeds that improved after she stopped using nasacort.   No new medicines, supplements.  Had first covid shot      Relevant past medical, surgical, family and social history reviewed and updated as indicated. Interim medical history since our last visit reviewed. Allergies and medications reviewed and updated. Outpatient Medications Prior to Visit  Medication Sig Dispense Refill  . cetirizine (ZYRTEC) 10 MG tablet Take 10 mg by mouth daily.    . Cholecalciferol (VITAMIN D) 50 MCG (2000 UT) CAPS Take 1 capsule (2,000 Units total) by mouth daily. 30 capsule    . ibuprofen (ADVIL,MOTRIN) 200 MG tablet Take 400 mg by mouth daily as needed.    [place] [facility] levothyroxine (SYNTHROID) 75 MCG tablet Take 1 tablet (75 mcg total) by mouth daily. 90 tablet 3  . vitamin B-12 (CYANOCOBALAMIN) 1000 MCG tablet Take 1 tablet (1,000 mcg total) by mouth daily.    . Tetrahydrozoline HCl (EYE DROPS OP) Apply 1 drop to eye daily as needed.    . Triamcinolone Acetonide (NASACORT ALLERGY 24HR NA) Place 1 spray into the nose daily as needed.     No facility-administered medications prior to visit.     Per HPI unless specifically indicated in ROS section below Review of Systems Objective:    BP 120/80 (BP Location: Left Arm, Patient Position: Sitting, Cuff Size: Normal)   Pulse 64   Temp 97.7 F (36.5 C) (Temporal)   Ht 5' 7.5" (1.715 m)   Wt 219 lb 9.6 oz (99.6 kg)   SpO2 100%   BMI 33.89 kg/m   Wt Readings from Last 3 Encounters:  12/28/19 219 lb 9.6 oz (99.6 kg)  10/11/19 215 lb (97.5 kg)  09/26/19 223 lb 7 oz (101.4 kg)    Physical Exam Vitals and nursing note reviewed.  Constitutional:      Appearance: Normal appearance. She is not ill-appearing.  HENT:  Head:      Comments:  Marked L periorbital swelling with mild erythema around eye, no warmth Pruritic erythema extending down neck, no warmth    Right Ear: Hearing, tympanic membrane, ear canal and external ear normal.     Left Ear: Hearing, tympanic membrane, ear canal and external ear normal.     Nose:     Right Nostril: No epistaxis or septal hematoma.     Left Nostril: Epistaxis present. No septal hematoma.     Comments: Source of bleed identified medial inner nostril, no active bleeding    Mouth/Throat:     Mouth: Mucous membranes are moist.     Palate: No mass.     Pharynx: Oropharynx is clear. No oropharyngeal exudate or posterior oropharyngeal erythema.     Tonsils: No tonsillar exudate.  Eyes:     Extraocular Movements: Extraocular movements intact.     Conjunctiva/sclera: Conjunctivae  normal.     Pupils: Pupils are equal, round, and reactive to light.  Musculoskeletal:     Cervical back: Normal range of motion and neck supple. No rigidity.  Lymphadenopathy:     Cervical: No cervical adenopathy.  Skin:    Findings: Erythema and rash present. Rash is crusting and vesicular.     Comments:  Healing papules at site of prior bites 2 weeks ago behind bilateral ears 3 newer papulovesicular lesions R below external ear, L anterior to ear and L forehead with mild surrounding erythema  Neurological:     Mental Status: She is alert.        Assessment & Plan:  This visit occurred during the SARS-CoV-2 public health emergency.  Safety protocols were in place, including screening questions prior to the visit, additional usage of staff PPE, and extensive cleaning of exam room while observing appropriate contact time as indicated for disinfecting solutions.   Problem List Items Addressed This Visit    Recurrent epistaxis    Recurrent nosebleeds initially worse with nasacort use, now persistent despite stopping INS. Discussed regular nasal saline irrigation. Reviewed allergen avoidance measures besides INS.      Relevant Orders   Ambulatory referral to ENT   Left facial swelling - Primary    Most consistent with localized allergic reaction to bug bite. No signs of anaphylaxis. Unsure what bit her. Has reacted like this in the past.  Treat with prednisone taper. Continue benadryl and zyrtec.  No other new exposure.  Knows to start using insect repellent.  Consider allergist evaluation.  Denies exposure to [place] although she reacts similarly to this.  Presumed bites that broke skin barrier so at risk for infection - provided with WASP for doxy course with instructions when to fill.           Meds ordered this encounter  Medications  . predniSONE (DELTASONE) 20 MG tablet    Sig: Take two tablets daily for 3 days followed by one tablet daily for 4 days    Dispense:   10 tablet    Refill:  0  . doxycycline (VIBRA-TABS) 100 MG tablet    Sig: Take 1 tablet (100 mg total) by mouth 2 (two) times daily.    Dispense:  20 tablet    Refill:  0   Orders Placed This Encounter  Procedures  . Ambulatory referral to ENT    Referral Priority:   Routine    Referral Type:   Consultation    Referral Reason:   Specialty Services Required    Requested  Specialty:   Otolaryngology    Number of Visits Requested:   1    Patient Instructions  For facial swelling - agree allergic to some kind of bug Treat with prednisone taper sent to pharmacy. Continue antihistamine.  If fever, worsening pain instead of itch, fill antibiotic printed out today.  Let us know how you do.   For L nosebleed - we will refer you to ENT for possible cauterization. Carry nasal saline with you.    Follow up plan: No follow-ups on file.  Ria Bush, MD

## 2019-12-28 NOTE — Patient Instructions (Addendum)
For facial swelling - agree allergic to some kind of bug Treat with prednisone taper sent to pharmacy. Continue antihistamine.  If fever, worsening pain instead of itch, fill antibiotic printed out today.  Let us know how you do.   For L nosebleed - we will refer you to ENT for possible cauterization. Carry nasal saline with you.

## 2020-01-24 ENCOUNTER — Ambulatory Visit: Payer: 59

## 2020-01-31 ENCOUNTER — Telehealth: Payer: Self-pay

## 2020-01-31 NOTE — Telephone Encounter (Addendum)
Pt has NV tomorrow and needs screened for covid. Also need to make sure pt has not had a covid vaccine within the last 2 wks. LVM

## 2020-02-01 ENCOUNTER — Other Ambulatory Visit: Payer: Self-pay

## 2020-02-01 ENCOUNTER — Ambulatory Visit (INDEPENDENT_AMBULATORY_CARE_PROVIDER_SITE_OTHER): Payer: 59

## 2020-02-01 DIAGNOSIS — Z23 Encounter for immunization: Secondary | ICD-10-CM

## 2020-02-01 NOTE — Progress Notes (Signed)
Per orders of Dr. Danise Mina, injection of shingrix given by Randall An. Patient tolerated injection well. Pt had last covid vaccine on 4/3.

## 2020-03-13 ENCOUNTER — Encounter: Payer: Self-pay | Admitting: Family Medicine

## 2020-03-14 MED ORDER — LEVOTHYROXINE SODIUM 75 MCG PO TABS: 75.0000 ug | ORAL_TABLET | Freq: Every day | ORAL | 1 refills | Status: DC

## 2020-03-14 NOTE — Telephone Encounter (Signed)
E-scribed refill 

## 2020-03-27 ENCOUNTER — Encounter: Payer: Self-pay | Admitting: Family Medicine

## 2020-03-27 ENCOUNTER — Ambulatory Visit: Payer: 59 | Admitting: Family Medicine

## 2020-03-27 ENCOUNTER — Other Ambulatory Visit: Payer: Self-pay

## 2020-03-27 DIAGNOSIS — L237 Allergic contact dermatitis due to plants, except food: Secondary | ICD-10-CM | POA: Insufficient documentation

## 2020-03-27 MED ORDER — CLOBETASOL PROPIONATE 0.05 % EX CREA: 1.0000 "application " | TOPICAL_CREAM | Freq: Two times a day (BID) | CUTANEOUS | 0 refills | Status: DC

## 2020-03-27 MED ORDER — PREDNISONE 20 MG PO TABS: ORAL_TABLET | ORAL | 0 refills | Status: AC

## 2020-03-27 NOTE — Patient Instructions (Signed)
Poison Ivy Dermatitis Poison ivy dermatitis is inflammation of the skin that is caused by chemicals in the leaves of the poison ivy plant. The skin reaction often involves redness, swelling, blisters, and extreme itching. What are the causes? This condition is caused by a chemical (urushiol) found in the sap of the poison ivy plant. This chemical is sticky and can be easily spread to people, animals, and objects. You can get poison ivy dermatitis by:  Having direct contact with a poison ivy plant.  Touching animals, other people, or objects that have come in contact with poison ivy and have the chemical on them. What increases the risk? This condition is more likely to develop in people who:  Are outdoors often in wooded or marshy areas.  Go outdoors without wearing protective clothing, such as closed shoes, long pants, and a long-sleeved shirt. What are the signs or symptoms? Symptoms of this condition include:  Redness of the skin.  Extreme itching.  A rash that often includes bumps and blisters. The rash usually appears 48 hours after exposure, if you have been exposed before. If this is the first time you have been exposed, the rash may not appear until a week after exposure.  Swelling. This may occur if the reaction is more severe. Symptoms usually last for 1-2 weeks. However, the first time you develop this condition, symptoms may last 3-4 weeks. How is this diagnosed? This condition may be diagnosed based on your symptoms and a physical exam. Your health care provider may also ask you about any recent outdoor activity. How is this treated? Treatment for this condition will vary depending on how severe it is. Treatment may include:  Hydrocortisone cream or calamine lotion to relieve itching.  Oatmeal baths to soothe the skin.  Medicines, such as over-the-counter antihistamine tablets.  Oral steroid medicine, for more severe reactions. Follow these instructions at  home: Medicines  Take or apply over-the-counter and prescription medicines only as told by your health care provider.  Use hydrocortisone cream or calamine lotion as needed to soothe the skin and relieve itching. General instructions  Do not scratch or rub your skin.  Apply a cold, wet cloth (cold compress) to the affected areas or take baths in cool water. This will help with itching. Avoid hot baths and showers.  Take oatmeal baths as needed. Use colloidal oatmeal. You can get this at your local pharmacy or grocery store. Follow the instructions on the packaging.  While you have the rash, wash clothes right after you wear them.  Keep all follow-up visits as told by your health care provider. This is important. How is this prevented?   Learn to identify the poison ivy plant and avoid contact with the plant. This plant can be recognized by the number of leaves. Generally, poison ivy has three leaves with flowering branches on a single stem. The leaves are typically glossy, and they have jagged edges that come to a point at the front.  If you have been exposed to poison ivy, thoroughly wash with soap and water right away. You have about 30 minutes to remove the plant resin before it will cause the rash. Be sure to wash under your fingernails, because any plant resin there will continue to spread the rash.  When hiking or camping, wear clothes that will help you to avoid exposure on the skin. This includes long pants, a long-sleeved shirt, tall socks, and hiking boots. You can also apply preventive lotion to your skin to   help limit exposure.  If you suspect that your clothes or outdoor gear came in contact with poison ivy, rinse them off outside with a garden hose before you bring them inside your house.  When doing yard work or gardening, wear gloves, long sleeves, long pants, and boots. Wash your garden tools and gloves if they come in contact with poison ivy.  If you suspect that your  pet has come into contact with poison ivy, wash him or her with pet shampoo and water. Make sure to wear gloves while washing your pet. Contact a health care provider if you have:  Open sores in the rash area.  More redness, swelling, or pain in the affected area.  Redness that spreads beyond the rash area.  Fluid, blood, or pus coming from the affected area.  A fever.  A rash over a large area of your body.  A rash on your eyes, mouth, or genitals.  A rash that does not improve after a few weeks. Get help right away if:  Your face swells or your eyes swell shut.  You have trouble breathing.  You have trouble swallowing. These symptoms may represent a serious problem that is an emergency. Do not wait to see if the symptoms will go away. Get medical help right away. Call your local emergency services (911 in the [place] Do not drive yourself to the hospital. Summary  Poison ivy dermatitis is inflammation of the skin that is caused by chemicals in the leaves of the poison ivy plant.  Symptoms of this condition include redness, itching, a rash, and swelling.  Do not scratch or rub your skin.  Take or apply over-the-counter and prescription medicines only as told by your health care provider. This information is not intended to replace advice given to you by your health care provider. Make sure you discuss any questions you have with your health care provider. Document Revised: 01/14/2019 Document Reviewed: 09/17/2018 Elsevier Patient Education  2020 [employer]  

## 2020-03-27 NOTE — Assessment & Plan Note (Addendum)
She is very allergic.  Diffuse contact dermatitis to anterior chest, R>L upper extremities.  Home remedies not taking care of this.  Will Rx 10d prednisone taper 60/40/20. Will also Rx clobetasol high potency topical steroid to any itchy vesicles. Update if not improving with treatment. Update if signs of bacterial superinfection. Pt agrees with plan.

## 2020-03-27 NOTE — Progress Notes (Signed)
This visit was conducted in person.  BP 122/74 (BP Location: Left Arm, Patient Position: Sitting, Cuff Size: Large)   Pulse 60   Temp 97.8 F (36.6 C) (Temporal)   Ht 5' 7.5" (1.715 m)   Wt 213 lb 8 oz (96.8 kg)   SpO2 97%   BMI 32.95 kg/m    CC: poison ivy Subjective:    Patient ID: Kirsten Barnes, female    DOB: 08/14/1966, 54 y.o.   MRN: [id number]  HPI: Kirsten Barnes is a 54 y.o. female presenting on 03/27/2020 for Poison Ivy (C/o poison ivy rash on center chest, and left arm.  Area on chest feels like sunburn.  Came in contact 03/21/20. )   Poison ivy contact 03/21/2020. Picked up son's dog which had recently been running in the woods.  Developed rash to center chest and into L arm. Now some vesicles to left arm.  She's tried hydrocortisone, benadryl spray, poison ivy lotion with calomine lotion, now using antiseptic.   No fevers/chills,  No recent tick bites.      Relevant past medical, surgical, family and social history reviewed and updated as indicated. Interim medical history since our last visit reviewed. Allergies and medications reviewed and updated. Outpatient Medications Prior to Visit  Medication Sig Dispense Refill  . cetirizine (ZYRTEC) 10 MG tablet Take 10 mg by mouth daily.    . Cholecalciferol (VITAMIN D) 50 MCG (2000 UT) CAPS Take 1 capsule (2,000 Units total) by mouth daily. 30 capsule   . ibuprofen (ADVIL,MOTRIN) 200 MG tablet Take 400 mg by mouth daily as needed.    Marland Kitchen levothyroxine (SYNTHROID) 75 MCG tablet Take 1 tablet (75 mcg total) by mouth daily. 90 tablet 1  . Tetrahydrozoline HCl (EYE DROPS OP) Apply 1 drop to eye daily as needed.    . vitamin B-12 (CYANOCOBALAMIN) 1000 MCG tablet Take 1 tablet (1,000 mcg total) by mouth daily.    Marland Kitchen doxycycline (VIBRA-TABS) 100 MG tablet Take 1 tablet (100 mg total) by mouth 2 (two) times daily. 20 tablet 0  . predniSONE (DELTASONE) 20 MG tablet Take two tablets daily for 3 days followed by one tablet daily for 4  days 10 tablet 0   No facility-administered medications prior to visit.     Per HPI unless specifically indicated in ROS section below Review of Systems Objective:  BP 122/74 (BP Location: Left Arm, Patient Position: Sitting, Cuff Size: Large)   Pulse 60   Temp 97.8 F (36.6 C) (Temporal)   Ht 5' 7.5" (1.715 m)   Wt 213 lb 8 oz (96.8 kg)   SpO2 97%   BMI 32.95 kg/m   Wt Readings from Last 3 Encounters:  03/27/20 213 lb 8 oz (96.8 kg)  12/28/19 219 lb 9.6 oz (99.6 kg)  10/11/19 215 lb (97.5 kg)      Physical Exam Vitals and nursing note reviewed.  Constitutional:      Appearance: Normal appearance. She is not ill-appearing.  Skin:    Findings: Rash present. Rash is vesicular.          Comments: Diffuse erythematous rash to mid anterior chest as well as linear vesicular clusters to right arm, with serous drainage. Few new vesicles to left forearm  Neurological:     Mental Status: She is alert.       Assessment & Plan:  This visit occurred during the SARS-CoV-2 public health emergency.  Safety protocols were in place, including screening questions prior to the visit, additional usage  of staff PPE, and extensive cleaning of exam room while observing appropriate contact time as indicated for disinfecting solutions.   Problem List Items Addressed This Visit    Contact dermatitis due to poison ivy    She is very allergic.  Diffuse contact dermatitis to anterior chest, R>L upper extremities.  Home remedies not taking care of this.  Will Rx 10d prednisone taper 60/40/20. Will also Rx clobetasol high potency topical steroid to any itchy vesicles. Update if not improving with treatment. Update if signs of bacterial superinfection. Pt agrees with plan.          Meds ordered this encounter  Medications  . predniSONE (DELTASONE) 20 MG tablet    Sig: Take 3 tablets (60 mg total) by mouth daily with breakfast for 3 days, THEN 2 tablets (40 mg total) daily with breakfast for 3  days, THEN 1 tablet (20 mg total) daily with breakfast for 4 days.    Dispense:  19 tablet    Refill:  0  . clobetasol cream (TEMOVATE) 0.05 %    Sig: Apply 1 application topically 2 (two) times daily. Apply to AA    Dispense:  45 g    Refill:  0   No orders of the defined types were placed in this encounter.   Follow up plan: Return if symptoms worsen or fail to improve.  Ria Bush, MD

## 2020-06-01 ENCOUNTER — Other Ambulatory Visit: Payer: Self-pay

## 2020-06-01 MED ORDER — LEVOTHYROXINE SODIUM 75 MCG PO TABS: 75.0000 ug | ORAL_TABLET | Freq: Every day | ORAL | 1 refills | Status: DC

## 2020-06-01 NOTE — Telephone Encounter (Signed)
Received faxed refill request.  E-scribed refill.

## 2020-08-27 ENCOUNTER — Other Ambulatory Visit: Payer: Self-pay | Admitting: Family Medicine

## 2020-08-27 DIAGNOSIS — Z1231 Encounter for screening mammogram for malignant neoplasm of breast: Secondary | ICD-10-CM

## 2020-08-29 ENCOUNTER — Other Ambulatory Visit: Payer: Self-pay

## 2020-08-29 ENCOUNTER — Ambulatory Visit
Admission: RE | Admit: 2020-08-29 | Discharge: 2020-08-29 | Disposition: A | Discharge status: Home / Self Care | Payer: 59 | Source: Ambulatory Visit | Attending: Family Medicine | Admitting: Family Medicine

## 2020-08-29 DIAGNOSIS — Z1231 Encounter for screening mammogram for malignant neoplasm of breast: Secondary | ICD-10-CM | POA: Insufficient documentation

## 2020-09-19 ENCOUNTER — Other Ambulatory Visit: Payer: Self-pay | Admitting: Family Medicine

## 2020-09-19 DIAGNOSIS — E785 Hyperlipidemia, unspecified: Secondary | ICD-10-CM

## 2020-09-19 DIAGNOSIS — E559 Vitamin D deficiency, unspecified: Secondary | ICD-10-CM

## 2020-09-19 DIAGNOSIS — E039 Hypothyroidism, unspecified: Secondary | ICD-10-CM

## 2020-09-19 DIAGNOSIS — E538 Deficiency of other specified B group vitamins: Secondary | ICD-10-CM

## 2020-09-20 ENCOUNTER — Other Ambulatory Visit: Payer: Self-pay

## 2020-09-20 ENCOUNTER — Other Ambulatory Visit (INDEPENDENT_AMBULATORY_CARE_PROVIDER_SITE_OTHER): Payer: 59

## 2020-09-20 DIAGNOSIS — E538 Deficiency of other specified B group vitamins: Secondary | ICD-10-CM

## 2020-09-20 DIAGNOSIS — E785 Hyperlipidemia, unspecified: Secondary | ICD-10-CM

## 2020-09-20 DIAGNOSIS — E559 Vitamin D deficiency, unspecified: Secondary | ICD-10-CM

## 2020-09-20 DIAGNOSIS — E039 Hypothyroidism, unspecified: Secondary | ICD-10-CM

## 2020-09-20 LAB — COMPREHENSIVE METABOLIC PANEL
ALT: 17 U/L (ref 0–35)
AST: 19 U/L (ref 0–37)
Albumin: 4.2 g/dL (ref 3.5–5.2)
Alkaline Phosphatase: 87 U/L (ref 39–117)
BUN: 11 mg/dL (ref 6–23)
CO2: 30 mEq/L (ref 19–32)
Calcium: 9.3 mg/dL (ref 8.4–10.5)
Chloride: 104 mEq/L (ref 96–112)
Creatinine, Ser: 0.78 mg/dL (ref 0.40–1.20)
GFR: 84.98 mL/min (ref >60.00)
Glucose, Bld: 93 mg/dL (ref 70–99)
Potassium: 4.1 mEq/L (ref 3.5–5.1)
Sodium: 140 mEq/L (ref 135–145)
Total Bilirubin: 0.6 mg/dL (ref 0.2–1.2)
Total Protein: 7 g/dL (ref 6.0–8.3)

## 2020-09-20 LAB — LIPID PANEL
Cholesterol: 234 mg/dL — ABNORMAL HIGH (ref 0–200)
HDL: 76.8 mg/dL (ref >39.00)
LDL Cholesterol: 138 mg/dL — ABNORMAL HIGH (ref 0–99)
NonHDL: 157.05
Total CHOL/HDL Ratio: 3
Triglycerides: 94 mg/dL (ref 0.0–149.0)
VLDL: 18.8 mg/dL (ref 0.0–40.0)

## 2020-09-20 LAB — VITAMIN B12: Vitamin B-12: 359 pg/mL (ref 211–911)

## 2020-09-20 LAB — VITAMIN D 25 HYDROXY (VIT D DEFICIENCY, FRACTURES): VITD: 32.64 ng/mL (ref 30.00–100.00)

## 2020-09-20 LAB — TSH: TSH: 8.63 u[IU]/mL — ABNORMAL HIGH (ref 0.35–4.50)

## 2020-09-26 ENCOUNTER — Ambulatory Visit (INDEPENDENT_AMBULATORY_CARE_PROVIDER_SITE_OTHER): Payer: 59 | Admitting: Family Medicine

## 2020-09-26 ENCOUNTER — Other Ambulatory Visit: Payer: Self-pay

## 2020-09-26 ENCOUNTER — Encounter: Payer: Self-pay | Admitting: Family Medicine

## 2020-09-26 VITALS — BP 150/82 | HR 63 | Temp 97.7°F | Ht 67.5 in | Wt 218.3 lb

## 2020-09-26 DIAGNOSIS — E538 Deficiency of other specified B group vitamins: Secondary | ICD-10-CM

## 2020-09-26 DIAGNOSIS — E669 Obesity, unspecified: Secondary | ICD-10-CM | POA: Diagnosis not present

## 2020-09-26 DIAGNOSIS — E039 Hypothyroidism, unspecified: Secondary | ICD-10-CM

## 2020-09-26 DIAGNOSIS — E559 Vitamin D deficiency, unspecified: Secondary | ICD-10-CM

## 2020-09-26 DIAGNOSIS — E785 Hyperlipidemia, unspecified: Secondary | ICD-10-CM

## 2020-09-26 DIAGNOSIS — Z0001 Encounter for general adult medical examination with abnormal findings: Secondary | ICD-10-CM | POA: Diagnosis not present

## 2020-09-26 MED ORDER — LEVOTHYROXINE SODIUM 88 MCG PO TABS: 88.0000 ug | ORAL_TABLET | Freq: Every day | ORAL | 3 refills | Status: DC

## 2020-09-26 NOTE — Progress Notes (Signed)
Patient ID: Kirsten Barnes, female    DOB: 1965/10/25, 54 y.o.   MRN: [id number]  This visit was conducted in person.  BP (!) 150/82 (BP Location: Left Arm, Patient Position: Sitting, Cuff Size: Large)   Pulse 63   Temp 97.7 F (36.5 C) (Temporal)   Ht 5' 7.5" (1.715 m)   Wt 218 lb 5 oz (99 kg)   SpO2 97%   BMI 33.69 kg/m   BP Readings from Last 3 Encounters:  09/26/20 (!) 150/82  03/27/20 122/74  12/28/19 120/80  162/92 on repeat testing  CC: CPE Subjective:   HPI: Kirsten Barnes is a 54 y.o. female presenting on 09/26/2020 for Annual Exam   Hypothyroidism - notes increasing cold intolerance, but then feels hot at night, memory troubles, hair loss, dry skin, britlte nails, constipation.   HTN - bp elevation noted today - attributes to increasing stressors.  Also noted increased GERD symptoms x3 wks - now better after diet changes and PRN pepcid.   Some finger joint pain and swelling (predominantly R 3rd PIP) worse in cold weather  Preventative: COLONOSCOPY 2015 polyps, rpt58yrs (Dr Harley Hallmark). Would like local referral in [place]. COLONOSCOPY WITH PROPOFOL 10/11/2019 - [facility] x2, rpt 3 yrs Vicente Males) Well woman with pap last 09/2019 - never abnormal. Desires to space out to 5 yrs.  Mammogram 08/2020 Birads1 @ [place]. Does breast exams at home without concerns. G2P2 LMP ~2012 menopause at age 62 Lung cancer screening - not eligible Flu shot yearly - declines this year [place] 12/2019, 01/2020, 07/2020 Tdap 09/2016 Shingrix 09/2019, 01/2020 Seat belt use discussed Sunscreen use discussed. No changing moles on skin. 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  Diet: good water, fruits/vegetables daily, enjoys cooking,recently joined [employer]     Relevant past  medical, surgical, family and social history reviewed and updated as indicated. Interim medical history since our last visit reviewed. Allergies and medications reviewed and updated. Outpatient Medications Prior to Visit  Medication Sig Dispense Refill  . cetirizine (ZYRTEC) 10 MG tablet Take 10 mg by mouth daily.    . Cholecalciferol (VITAMIN D) 50 MCG (2000 UT) CAPS Take 1 capsule (2,000 Units total) by mouth daily. 30 capsule   . ibuprofen (ADVIL,MOTRIN) 200 MG tablet Take 400 mg by mouth daily as needed.    . Tetrahydrozoline HCl (EYE DROPS OP) Apply 1 drop to eye daily as needed.    . vitamin B-12 (CYANOCOBALAMIN) 1000 MCG tablet Take 1 tablet (1,000 mcg total) by mouth daily.    Marland Kitchen levothyroxine (SYNTHROID) 75 MCG tablet Take 1 tablet (75 mcg total) by mouth daily. 90 tablet 1  . clobetasol cream (TEMOVATE) [id number] % Apply 1 application topically 2 (two) times daily. Apply to AA 45 g 0   No facility-administered medications prior to visit.     Per HPI unless specifically indicated in ROS section below Review of Systems  Constitutional: Negative for activity change, appetite change, chills, fatigue, fever and unexpected weight change.  HENT: Negative for hearing loss.   Eyes: Negative for visual disturbance.  Respiratory: Negative for cough, chest tightness, shortness of breath and wheezing.   Cardiovascular: Negative for chest pain, palpitations and leg swelling.  Gastrointestinal: Negative for abdominal distention, abdominal pain, blood in stool, constipation, diarrhea, nausea and vomiting.  Genitourinary: Negative for difficulty urinating and hematuria.  Musculoskeletal: Negative for arthralgias, myalgias and neck pain.  Skin: Negative for rash.  Neurological: Negative for dizziness, seizures, syncope and headaches.  Hematological: Negative for adenopathy. Does not bruise/bleed easily.  Psychiatric/Behavioral: Negative for dysphoric mood. The patient is not nervous/anxious.     Objective:  BP (!) 150/82 (BP Location: Left Arm, Patient Position: Sitting, Cuff Size: Large)   Pulse 63   Temp 97.7 F (36.5 C) (Temporal)   Ht 5' 7.5" (1.715 m)   Wt 218 lb 5 oz (99 kg)   SpO2 97%   BMI 33.69 kg/m   Wt Readings from Last 3 Encounters:  09/26/20 218 lb 5 oz (99 kg)  03/27/20 213 lb 8 oz (96.8 kg)  12/28/19 219 lb 9.6 oz (99.6 kg)      Physical Exam Vitals and nursing note reviewed.  Constitutional:      General: She is not in acute distress.    Appearance: Normal appearance. She is well-developed and well-nourished. She is not ill-appearing.  HENT:     Head: Normocephalic and atraumatic.     Right Ear: Hearing, tympanic membrane, ear canal and external ear normal.     Left Ear: Hearing, tympanic membrane, ear canal and external ear normal.     Mouth/Throat:     Mouth: Oropharynx is clear and moist and mucous membranes are normal.     Pharynx: No posterior oropharyngeal edema.  Eyes:     General: No scleral icterus.    Extraocular Movements: Extraocular movements intact and EOM normal.     Conjunctiva/sclera: Conjunctivae normal.     Pupils: Pupils are equal, round, and reactive to light.  Neck:     Thyroid: No thyroid mass or thyromegaly.  Cardiovascular:     Rate and Rhythm: Normal rate and regular rhythm.     Pulses: Normal pulses and intact distal pulses.          Radial pulses are 2+ on the right side and 2+ on the left side.     Heart sounds: Normal heart sounds. No murmur heard.   Pulmonary:     Effort: Pulmonary effort is normal. No respiratory distress.     Breath sounds: Normal breath sounds. No wheezing, rhonchi or rales.  Abdominal:     General: Abdomen is flat. Bowel sounds are normal. There is no distension.     Palpations: Abdomen is soft. There is no mass.     Tenderness: There is no abdominal tenderness. There is no guarding or rebound.     Hernia: No hernia is present.  Musculoskeletal:        General: No edema. Normal range  of motion.     Cervical back: Normal range of motion and neck supple.     Right lower leg: No edema.     Left lower leg: No edema.  Lymphadenopathy:     Cervical: No cervical adenopathy.  Skin:    General: Skin is warm and dry.     Findings: No rash.  Neurological:     General: No focal deficit present.     Mental Status: She is alert and oriented to person, place, and time.     Comments: CN grossly intact, station and gait intact  Psychiatric:        Mood and Affect: Mood and affect normal.        Behavior: Behavior normal.        Thought Content: Thought content normal.  Judgment: Judgment normal.       Results for orders placed or performed in visit on 09/20/20  VITAMIN D 25 Hydroxy (Vit-D Deficiency, Fractures)  Result Value Ref Range   VITD 32.64 30.00 - 100.00 ng/mL  Vitamin B12  Result Value Ref Range   Vitamin B-12 359 211 - 911 pg/mL  Lipid panel  Result Value Ref Range   Cholesterol 234 (H) 0 - 200 mg/dL   Triglycerides 35.5 0.0 - 149.0 mg/dL   HDL 97.41 >63.84 mg/dL   VLDL 53.6 0.0 - 46.8 mg/dL   LDL Cholesterol 032 (H) 0 - 99 mg/dL   Total CHOL/HDL Ratio 3    NonHDL 157.05   Comprehensive metabolic panel  Result Value Ref Range   Sodium 140 135 - 145 mEq/L   Potassium 4.1 3.5 - 5.1 mEq/L   Chloride 104 96 - 112 mEq/L   CO2 30 19 - 32 mEq/L   Glucose, Bld 93 70 - 99 mg/dL   BUN 11 6 - 23 mg/dL   Creatinine, Ser 1.22 0.40 - 1.20 mg/dL   Total Bilirubin 0.6 0.2 - 1.2 mg/dL   Alkaline Phosphatase 87 39 - 117 U/L   AST 19 0 - 37 U/L   ALT 17 0 - 35 U/L   Total Protein 7.0 6.0 - 8.3 g/dL   Albumin 4.2 3.5 - 5.2 g/dL   GFR 48.25 >00.37 mL/min   Calcium 9.3 8.4 - 10.5 mg/dL  TSH  Result Value Ref Range   TSH 8.63 (H) 0.35 - 4.50 uIU/mL   Assessment & Plan:  This visit occurred during the SARS-CoV-2 public health emergency.  Safety protocols were in place, including screening questions prior to the visit, additional usage of staff PPE, and extensive  cleaning of exam room while observing appropriate contact time as indicated for disinfecting solutions.   Problem List Items Addressed This Visit    Vitamin D deficiency    Continue vit D replacement.       Vitamin B12 deficiency    Continue b12 replacement.       Obesity, Class I, BMI 30-34.9    Continue to encourage healthy diet and lifestyle choices to affect sustainable weight loss.       Hypothyroidism    Chronic, TSH elevated and endorses several hypothyroid symptoms - will increase dose to daily, recheck TFT in 6-8 wks      Relevant Medications   levothyroxine (SYNTHROID) 88 MCG tablet   Other Relevant Orders   TSH   T4, free   Encounter for general adult medical examination with abnormal findings - Primary    Preventative protocols reviewed and updated unless pt declined. Discussed healthy diet and lifestyle.       Dyslipidemia    Chronic, deteriorated - possibly related to hypothyroidism - will reassess after better thyroid levels. The 10-year ASCVD risk score Denman George DC Montez Hageman., et al., 2013) is: 2.6%   Values used to calculate the score:     Age: 74 years     Sex: Female     Is Non-Hispanic African American: No     Diabetic: No     Tobacco smoker: No     Systolic Blood Pressure: 150 mmHg     Is BP treated: No     HDL Cholesterol: 76.8 mg/dL     Total Cholesterol: 234 mg/dL           Meds ordered this encounter  Medications  . levothyroxine (SYNTHROID) 88 MCG tablet  Sig: Take 1 tablet (88 mcg total) by mouth daily.    Dispense:  30 tablet    Refill:  3   Orders Placed This Encounter  Procedures  . TSH    Standing Status:   Future    Standing Expiration Date:   09/26/2021  . T4, free    Standing Status:   Future    Standing Expiration Date:   09/26/2021    Patient instructions: BP elevation noted today - keep an eye on this at home and let me know if consistently >140/90.  Increase levothyroxine dose to 19mcg daily - sent to pharmacy.  Return in 6-8 weeks to recheck labwork (doesn't have to be fasting).  Change vitamin administration to AM.  Return as needed or in 1 year for next physical.   Follow up plan: Return in about 1 year (around 09/26/2021) for annual exam, prior fasting for blood work.  Ria Bush, MD

## 2020-09-26 NOTE — Patient Instructions (Addendum)
BP elevation noted today - keep an eye on this at home and let me know if consistently >140/90.  Increase levothyroxine dose to 109mcg daily - sent to pharmacy. Return in 6-8 weeks to recheck labwork (doesn't have to be fasting).  Change vitamin administration to AM.  Return as needed or in 1 year for next physical.   Health Maintenance for Postmenopausal Women Menopause is a normal process in which your ability to get pregnant comes to an end. This process happens slowly over many months or years, usually between the ages of 107 and 15. Menopause is complete when you have missed your menstrual periods for 12 months. It is important to talk with your health care provider about some of the most common conditions that affect women after menopause (postmenopausal women). These include heart disease, cancer, and bone loss (osteoporosis). Adopting a healthy lifestyle and getting preventive care can help to promote your health and wellness. The actions you take can also lower your chances of developing some of these common conditions. What should I know about menopause? During menopause, you may get a number of symptoms, such as:  Hot flashes. These can be moderate or severe.  Night sweats.  Decrease in sex drive.  Mood swings.  Headaches.  Tiredness.  Irritability.  Memory problems.  Insomnia. Choosing to treat or not to treat these symptoms is a decision that you make with your health care provider. Do I need hormone replacement therapy?  Hormone replacement therapy is effective in treating symptoms that are caused by menopause, such as hot flashes and night sweats.  Hormone replacement carries certain risks, especially as you become older. If you are thinking about using estrogen or estrogen with progestin, discuss the benefits and risks with your health care provider. What is my risk for heart disease and stroke? The risk of heart disease, heart attack, and stroke increases as you age.  One of the causes may be a change in the body's hormones during menopause. This can affect how your body uses dietary fats, triglycerides, and cholesterol. Heart attack and stroke are medical emergencies. There are many things that you can do to help prevent heart disease and stroke. Watch your blood pressure  High blood pressure causes heart disease and increases the risk of stroke. This is more likely to develop in people who have high blood pressure readings, are of African descent, or are overweight.  Have your blood pressure checked: ? Every 3-5 years if you are 47-71 years of age. ? Every year if you are 71 years old or older. Eat a healthy diet   Eat a diet that includes plenty of vegetables, fruits, low-fat dairy products, and lean protein.  Do not eat a lot of foods that are high in solid fats, added sugars, or sodium. Get regular exercise Get regular exercise. This is one of the most important things you can do for your health. Most adults should:  Try to exercise for at least 150 minutes each week. The exercise should increase your heart rate and make you sweat (moderate-intensity exercise).  Try to do strengthening exercises at least twice each week. Do these in addition to the moderate-intensity exercise.  Spend less time sitting. Even light physical activity can be beneficial. Other tips  Work with your health care provider to achieve or maintain a healthy weight.  Do not use any products that contain nicotine or tobacco, such as cigarettes, e-cigarettes, and chewing tobacco. If you need help quitting, ask your health  care provider.  Know your numbers. Ask your health care provider to check your cholesterol and your blood sugar (glucose). Continue to have your blood tested as directed by your health care provider. Do I need screening for cancer? Depending on your health history and family history, you may need to have cancer screening at different stages of your life. This  may include screening for:  Breast cancer.  Cervical cancer.  Lung cancer.  Colorectal cancer. What is my risk for osteoporosis? After menopause, you may be at increased risk for osteoporosis. Osteoporosis is a condition in which bone destruction happens more quickly than new bone creation. To help prevent osteoporosis or the bone fractures that can happen because of osteoporosis, you may take the following actions:  If you are 54-89 years old, get at least 1,000 mg of calcium and at least 600 mg of vitamin D per day.  If you are older than age 67 but younger than age 32, get at least 1,200 mg of calcium and at least 600 mg of vitamin D per day.  If you are older than age 39, get at least 1,200 mg of calcium and at least 800 mg of vitamin D per day. Smoking and drinking excessive alcohol increase the risk of osteoporosis. Eat foods that are rich in calcium and vitamin D, and do weight-bearing exercises several times each week as directed by your health care provider. How does menopause affect my mental health? Depression may occur at any age, but it is more common as you become older. Common symptoms of depression include:  Low or sad mood.  Changes in sleep patterns.  Changes in appetite or eating patterns.  Feeling an overall lack of motivation or enjoyment of activities that you previously enjoyed.  Frequent crying spells. Talk with your health care provider if you think that you are experiencing depression. General instructions See your health care provider for regular wellness exams and vaccines. This may include:  Scheduling regular health, dental, and eye exams.  Getting and maintaining your vaccines. These include: ? Influenza vaccine. Get this vaccine each year before the flu season begins. ? Pneumonia vaccine. ? Shingles vaccine. ? Tetanus, diphtheria, and pertussis (Tdap) booster vaccine. Your health care provider may also recommend other immunizations. Tell your  health care provider if you have ever been abused or do not feel safe at home. Summary  Menopause is a normal process in which your ability to get pregnant comes to an end.  This condition causes hot flashes, night sweats, decreased interest in sex, mood swings, headaches, or lack of sleep.  Treatment for this condition may include hormone replacement therapy.  Take actions to keep yourself healthy, including exercising regularly, eating a healthy diet, watching your weight, and checking your blood pressure and blood sugar levels.  Get screened for cancer and depression. Make sure that you are up to date with all your vaccines. This information is not intended to replace advice given to you by your health care provider. Make sure you discuss any questions you have with your health care provider. Document Revised: 09/15/2018 Document Reviewed: 09/15/2018 Elsevier Patient Education  2020 [employer].

## 2020-09-26 NOTE — Assessment & Plan Note (Signed)
Preventative protocols reviewed and updated unless pt declined. Discussed healthy diet and lifestyle.  

## 2020-09-26 NOTE — Assessment & Plan Note (Signed)
Continue to encourage healthy diet and lifestyle choices to affect sustainable weight loss.  °

## 2020-09-26 NOTE — Assessment & Plan Note (Signed)
Continue vit D replacement.  

## 2020-09-26 NOTE — Assessment & Plan Note (Signed)
Chronic, deteriorated - possibly related to hypothyroidism - will reassess after better thyroid levels. The 10-year ASCVD risk score Mikey Bussing DC Brooke Bonito., et al., 2013) is: 2.6%   Values used to calculate the score:     Age: 54 years     Sex: Female     Is Non-Hispanic African American: No     Diabetic: No     Tobacco smoker: No     Systolic Blood Pressure: 005 mmHg     Is BP treated: No     HDL Cholesterol: 76.8 mg/dL     Total Cholesterol: 234 mg/dL

## 2020-09-26 NOTE — Assessment & Plan Note (Signed)
Continue b12 replacement.  

## 2020-09-26 NOTE — Assessment & Plan Note (Signed)
Chronic, TSH elevated and endorses several hypothyroid symptoms - will increase dose to 48mcg daily, recheck TFT in 6-8 wks

## 2020-10-21 ENCOUNTER — Other Ambulatory Visit: Payer: Self-pay | Admitting: Family Medicine

## 2020-11-20 ENCOUNTER — Other Ambulatory Visit: Payer: Self-pay

## 2020-11-20 ENCOUNTER — Other Ambulatory Visit (INDEPENDENT_AMBULATORY_CARE_PROVIDER_SITE_OTHER): Payer: 59

## 2020-11-20 DIAGNOSIS — E039 Hypothyroidism, unspecified: Secondary | ICD-10-CM | POA: Diagnosis not present

## 2020-11-20 LAB — TSH: TSH: 7.28 u[IU]/mL — ABNORMAL HIGH (ref 0.35–4.50)

## 2020-11-20 LAB — T4, FREE: Free T4: 0.75 ng/dL (ref 0.60–1.60)

## 2020-11-24 ENCOUNTER — Other Ambulatory Visit: Payer: Self-pay | Admitting: Family Medicine

## 2020-11-24 DIAGNOSIS — E039 Hypothyroidism, unspecified: Secondary | ICD-10-CM

## 2020-11-24 MED ORDER — LEVOTHYROXINE SODIUM 100 MCG PO TABS: 100.0000 ug | ORAL_TABLET | Freq: Every day | ORAL | 3 refills | Status: DC

## 2020-11-24 NOTE — Addendum Note (Signed)
Addended by: Ria Bush on: 11/24/2020 09:41 AM   Modules accepted: Orders

## 2020-11-29 ENCOUNTER — Other Ambulatory Visit: Payer: 59

## 2020-12-24 ENCOUNTER — Other Ambulatory Visit: Payer: Self-pay

## 2020-12-24 ENCOUNTER — Encounter: Payer: Self-pay | Admitting: Family Medicine

## 2020-12-24 ENCOUNTER — Ambulatory Visit (INDEPENDENT_AMBULATORY_CARE_PROVIDER_SITE_OTHER): Payer: 59 | Admitting: Family Medicine

## 2020-12-24 ENCOUNTER — Ambulatory Visit (INDEPENDENT_AMBULATORY_CARE_PROVIDER_SITE_OTHER)
Admission: RE | Admit: 2020-12-24 | Discharge: 2020-12-24 | Disposition: A | Discharge status: Home / Self Care | Payer: 59 | Source: Ambulatory Visit | Attending: Family Medicine | Admitting: Family Medicine

## 2020-12-24 VITALS — BP 106/68 | HR 67 | Temp 96.9°F | Ht 67.5 in | Wt 223.5 lb

## 2020-12-24 DIAGNOSIS — M545 Low back pain, unspecified: Secondary | ICD-10-CM | POA: Diagnosis not present

## 2020-12-24 DIAGNOSIS — N819 Female genital prolapse, unspecified: Secondary | ICD-10-CM | POA: Diagnosis not present

## 2020-12-24 LAB — POC URINALSYSI DIPSTICK (AUTOMATED)
Bilirubin, UA: NEGATIVE
Blood, UA: NEGATIVE
Glucose, UA: NEGATIVE
Ketones, UA: NEGATIVE
Leukocytes, UA: NEGATIVE
Nitrite, UA: NEGATIVE
Protein, UA: NEGATIVE
Spec Grav, UA: 1.025 (ref 1.010–1.025)
Urobilinogen, UA: 0.2 E.U./dL
pH, UA: 6 (ref 5.0–8.0)

## 2020-12-24 MED ORDER — METHOCARBAMOL 500 MG PO TABS: 500.0000 mg | ORAL_TABLET | Freq: Three times a day (TID) | ORAL | 0 refills | Status: DC | PRN

## 2020-12-24 NOTE — Patient Instructions (Signed)
Urinalysis today  Back xray today  Trial muscle relaxant sent to pharmacy.  We will also refer you to GYN for further evaluation.

## 2020-12-24 NOTE — Assessment & Plan Note (Addendum)
3 wk of midline stabbing pain worse with transitions. No red flags.  Given reproducible midline pain, check lumbar films. ?lumbar strain.  Rx methocarbamol with sedation precautions.  Check UA today - clear.

## 2020-12-24 NOTE — Progress Notes (Signed)
Patient ID: Kirsten Barnes, female    DOB: 10/25/1965, 55 y.o.   MRN: [id number]  This visit was conducted in person.  BP 106/68 (BP Location: Left Arm, Patient Position: Sitting, Cuff Size: Normal)   Pulse 67   Temp (!) 96.9 F (36.1 C) (Temporal)   Ht 5' 7.5" (1.715 m)   Wt 223 lb 8 oz (101.4 kg)   SpO2 97%   BMI 34.49 kg/m    CC: back pain/injury  Subjective:   HPI: Kirsten Barnes is a 55 y.o. female presenting on 12/24/2020 for Back Injury and Back Pain   DOI: 3 wks ago Stabbing pain to midline lower back when she went to reach for mug in her kitchen. Felt sharp stabbing pain to mid lower back. No problem when standing, walking, has been doing yoga with benefit. Stabbing pain only noted with transitions ie getting up out of chair. Denies inciting trauma/injury or falls.   No fevers/chills, shooting pain, numbness or weakness down legs, saddle anesthesia, bowel/bladder incontinence. No dysuria, urgency, frequency, hematuria. No nausea/vomiting, flank pain.   Over the weekend wonders if she's had pelvic organ prolapse as feels vaginal pressure "something popping down".   LMP age 12 yo.  No pelvic surgeries in the past.  2 vaginal births.      Relevant past medical, surgical, family and social history reviewed and updated as indicated. Interim medical history since our last visit reviewed. Allergies and medications reviewed and updated. Outpatient Medications Prior to Visit  Medication Sig Dispense Refill  . cetirizine (ZYRTEC) 10 MG tablet Take 10 mg by mouth daily.    . Cholecalciferol (VITAMIN D) 50 MCG (2000 UT) CAPS Take 1 capsule (2,000 Units total) by mouth daily. 30 capsule   . ibuprofen (ADVIL,MOTRIN) 200 MG tablet Take 400 mg by mouth daily as needed.    Marland Kitchen levothyroxine (SYNTHROID) 100 MCG tablet Take 1 tablet (100 mcg total) by mouth daily. 30 tablet 3  . Tetrahydrozoline HCl (EYE DROPS OP) Apply 1 drop to eye daily as needed.    . vitamin B-12 (CYANOCOBALAMIN)  1000 MCG tablet Take 1 tablet (1,000 mcg total) by mouth daily.     No facility-administered medications prior to visit.     Per HPI unless specifically indicated in ROS section below Review of Systems Objective:  BP 106/68 (BP Location: Left Arm, Patient Position: Sitting, Cuff Size: Normal)   Pulse 67   Temp (!) 96.9 F (36.1 C) (Temporal)   Ht 5' 7.5" (1.715 m)   Wt 223 lb 8 oz (101.4 kg)   SpO2 97%   BMI 34.49 kg/m   Wt Readings from Last 3 Encounters:  12/24/20 223 lb 8 oz (101.4 kg)  09/26/20 218 lb 5 oz (99 kg)  03/27/20 213 lb 8 oz (96.8 kg)      Physical Exam Vitals and nursing note reviewed. Exam conducted with a chaperone present.  Constitutional:      Appearance: Normal appearance. She is not ill-appearing.  Abdominal:     Hernia: There is no hernia in the left inguinal area or right inguinal area.  Genitourinary:    General: Normal vulva.     Exam position: Supine.     Pubic Area: No rash.      Labia:        Right: No rash, tenderness or lesion.        Left: No rash, tenderness or lesion.      Urethra: No prolapse, urethral pain or urethral  swelling.     Vagina: Normal.     Cervix: Normal.     Uterus: Normal.      Adnexa: Right adnexa normal and left adnexa normal.     Comments: Possible rectocele with straining Musculoskeletal:        General: No tenderness.     Right lower leg: No edema.     Left lower leg: No edema.     Comments:  Discomfort midline mid lumbar spine No paraspinous mm tenderness Neg SLR bilaterally. No pain with int/ext rotation at hip. Neg FABER. No pain at SIJ, GTB or sciatic notch bilaterally.   Skin:    General: Skin is warm and dry.     Findings: No rash.  Neurological:     Mental Status: She is alert.  Psychiatric:        Mood and Affect: Mood normal.        Behavior: Behavior normal.       Results for orders placed or performed in visit on 12/24/20  POCT Urinalysis Dipstick (Automated)  Result Value Ref Range    Color, UA Light Yellow    Clarity, UA Clear    Glucose, UA Negative Negative   Bilirubin, UA Negative    Ketones, UA Negative    Spec Grav, UA 1.025 1.010 - 1.025   Blood, UA Negative    pH, UA 6.0 5.0 - 8.0   Protein, UA Negative Negative   Urobilinogen, UA 0.2 0.2 or 1.0 E.U./dL   Nitrite, UA Negative    Leukocytes, UA Negative Negative   Assessment & Plan:  This visit occurred during the SARS-CoV-2 public health emergency.  Safety protocols were in place, including screening questions prior to the visit, additional usage of staff PPE, and extensive cleaning of exam room while observing appropriate contact time as indicated for disinfecting solutions.   Problem List Items Addressed This Visit    Acute midline low back pain without sciatica - Primary    3 wk of midline stabbing pain worse with transitions. No red flags.  Given reproducible midline pain, check lumbar films. ?lumbar strain.  Rx methocarbamol with sedation precautions.  Check UA today - clear.       Relevant Medications   methocarbamol (ROBAXIN) 500 MG tablet   Other Relevant Orders   DG Lumbar Spine Complete   POCT Urinalysis Dipstick (Automated) (Completed)   Prolapse of female pelvic organs    Possible rectal prolapse - will refer to GYN for eval. Pt agrees with plan.       Relevant Orders   Ambulatory referral to Gynecology   POCT Urinalysis Dipstick (Automated) (Completed)       Meds ordered this encounter  Medications  . methocarbamol (ROBAXIN) 500 MG tablet    Sig: Take 1 tablet (500 mg total) by mouth 3 (three) times daily as needed for muscle spasms (sedation precautions).    Dispense:  30 tablet    Refill:  0   Orders Placed This Encounter  Procedures  . DG Lumbar Spine Complete    Standing Status:   Future    Number of Occurrences:   1    Standing Expiration Date:   12/24/2021    Order Specific Question:   Reason for Exam (SYMPTOM  OR DIAGNOSIS REQUIRED)    Answer:   lower back pain     Order Specific Question:   Is patient pregnant?    Answer:   No    Order Specific Question:   Preferred  imaging location?    Answer:   [facility]  . Ambulatory referral to Gynecology    Referral Priority:   Routine    Referral Type:   Consultation    Referral Reason:   Specialty Services Required    Requested Specialty:   Gynecology    Number of Visits Requested:   1  . POCT Urinalysis Dipstick (Automated)    Patient Instructions  Urinalysis today  Back xray today  Trial muscle relaxant sent to pharmacy.  We will also refer you to GYN for further evaluation.  Follow up plan: Return if symptoms worsen or fail to improve.  Ria Bush, MD

## 2020-12-24 NOTE — Assessment & Plan Note (Signed)
Possible rectal prolapse - will refer to GYN for eval. Pt agrees with plan.

## 2020-12-28 ENCOUNTER — Encounter: Payer: Self-pay | Admitting: Family Medicine

## 2020-12-28 DIAGNOSIS — M8580 Other specified disorders of bone density and structure, unspecified site: Secondary | ICD-10-CM | POA: Insufficient documentation

## 2021-01-08 ENCOUNTER — Other Ambulatory Visit: Payer: 59

## 2021-01-09 ENCOUNTER — Telehealth: Payer: Self-pay | Admitting: Radiology

## 2021-01-09 ENCOUNTER — Encounter: Payer: Self-pay | Admitting: Family Medicine

## 2021-01-09 DIAGNOSIS — M7989 Other specified soft tissue disorders: Secondary | ICD-10-CM

## 2021-01-09 DIAGNOSIS — M79643 Pain in unspecified hand: Secondary | ICD-10-CM

## 2021-01-09 DIAGNOSIS — E039 Hypothyroidism, unspecified: Secondary | ICD-10-CM

## 2021-01-09 NOTE — Telephone Encounter (Signed)
Left voicemail to call [facility] to schedule New GYN appointment from referral

## 2021-01-14 ENCOUNTER — Other Ambulatory Visit: Payer: Self-pay

## 2021-01-14 ENCOUNTER — Other Ambulatory Visit (INDEPENDENT_AMBULATORY_CARE_PROVIDER_SITE_OTHER): Payer: 59

## 2021-01-14 DIAGNOSIS — M79643 Pain in unspecified hand: Secondary | ICD-10-CM

## 2021-01-14 DIAGNOSIS — M7989 Other specified soft tissue disorders: Secondary | ICD-10-CM

## 2021-01-14 LAB — CBC WITH DIFFERENTIAL/PLATELET
Basophils Absolute: 0 10*3/uL (ref 0.0–0.1)
Basophils Relative: 0.7 % (ref 0.0–3.0)
Eosinophils Absolute: 0.1 10*3/uL (ref 0.0–0.7)
Eosinophils Relative: 2.7 % (ref 0.0–5.0)
HCT: 45 % (ref 36.0–46.0)
Hemoglobin: 15.1 g/dL — ABNORMAL HIGH (ref 12.0–15.0)
Lymphocytes Relative: 38.8 % (ref 12.0–46.0)
Lymphs Abs: 2 10*3/uL (ref 0.7–4.0)
MCHC: 33.5 g/dL (ref 30.0–36.0)
MCV: 88 fl (ref 78.0–100.0)
Monocytes Absolute: 0.4 10*3/uL (ref 0.1–1.0)
Monocytes Relative: 7.4 % (ref 3.0–12.0)
Neutro Abs: 2.6 10*3/uL (ref 1.4–7.7)
Neutrophils Relative %: 50.4 % (ref 43.0–77.0)
Platelets: 269 10*3/uL (ref 150.0–400.0)
RBC: 5.12 Mil/uL — ABNORMAL HIGH (ref 3.87–5.11)
RDW: 13.4 % (ref 11.5–15.5)
WBC: 5.2 10*3/uL (ref 4.0–10.5)

## 2021-01-14 LAB — BASIC METABOLIC PANEL
BUN: 12 mg/dL (ref 6–23)
CO2: 29 mEq/L (ref 19–32)
Calcium: 9.4 mg/dL (ref 8.4–10.5)
Chloride: 104 mEq/L (ref 96–112)
Creatinine, Ser: 0.75 mg/dL (ref 0.40–1.20)
GFR: 88.88 mL/min (ref >60.00)
Glucose, Bld: 82 mg/dL (ref 70–99)
Potassium: 4.2 mEq/L (ref 3.5–5.1)
Sodium: 140 mEq/L (ref 135–145)

## 2021-01-14 LAB — C-REACTIVE PROTEIN: CRP: <1 mg/dL (ref 0.5–20.0)

## 2021-01-14 LAB — T4, FREE: Free T4: 0.68 ng/dL (ref 0.60–1.60)

## 2021-01-14 LAB — SEDIMENTATION RATE: Sed Rate: 24 mm/hr (ref 0–30)

## 2021-01-14 LAB — TSH: TSH: 6.67 u[IU]/mL — ABNORMAL HIGH (ref 0.35–4.50)

## 2021-01-14 NOTE — Addendum Note (Signed)
Addended by: Ellamae Sia on: 01/14/2021 09:03 AM   Modules accepted: Orders

## 2021-01-14 NOTE — Telephone Encounter (Signed)
Spoke w/ pt at lab visit. Marked swelling, warmth, erythema of hands recently.

## 2021-01-15 LAB — RHEUMATOID FACTOR: Rheumatoid fact SerPl-aCnc: <14 IU/mL (ref <14)

## 2021-01-15 LAB — CYCLIC CITRUL PEPTIDE ANTIBODY, IGG: Cyclic Citrullin Peptide Ab: <16 UNITS

## 2021-01-18 ENCOUNTER — Other Ambulatory Visit: Payer: Self-pay | Admitting: Family Medicine

## 2021-01-18 DIAGNOSIS — E039 Hypothyroidism, unspecified: Secondary | ICD-10-CM

## 2021-01-18 MED ORDER — LEVOTHYROXINE SODIUM 112 MCG PO TABS: 112.0000 ug | ORAL_TABLET | Freq: Every day | ORAL | 6 refills | Status: DC

## 2021-02-18 ENCOUNTER — Ambulatory Visit: Payer: 59 | Admitting: Obstetrics and Gynecology

## 2021-02-18 ENCOUNTER — Other Ambulatory Visit: Payer: Self-pay

## 2021-02-18 ENCOUNTER — Encounter: Payer: Self-pay | Admitting: Obstetrics and Gynecology

## 2021-02-18 VITALS — BP 137/89 | HR 71 | Ht 68.0 in | Wt 225.0 lb

## 2021-02-18 DIAGNOSIS — N814 Uterovaginal prolapse, unspecified: Secondary | ICD-10-CM

## 2021-02-18 NOTE — Progress Notes (Signed)
Obstetrics and Gynecology New Patient Evaluation  Appointment Date: 02/18/2021  OBGYN Clinic: [facility]  Primary Care Provider: Ria Bush  Referring Provider: Ria Bush, MD  Chief Complaint: bulge like s/s in the vaginal area  History of Present Illness: Kirsten Barnes is a 55 y.o. G2P2002 (LMP: mid 82s), seen for the above chief complaint.  Has been feeling these s/s for approximately the past 8 months and more so in the past 5-6 months because she has been more physically active.   She does not smoke, no chronic cough, constipation, issues with bladder emptying or sexual intercourse.   Review of Systems: Pertinent items noted in HPI and remainder of comprehensive ROS otherwise negative.   Patient Active Problem List   Diagnosis Date Noted  . Decreased bone density 12/28/2020  . Acute midline low back pain without sciatica 12/24/2020  . Prolapse of female pelvic organs 12/24/2020  . Contact dermatitis due to poison ivy 03/27/2020  . Recurrent epistaxis 12/28/2019  . Left anterior knee pain 09/20/2018  . Encounter for general adult medical examination with abnormal findings 09/17/2017  . Dyslipidemia 09/17/2017  . Vitamin D deficiency 01/25/2017  . Vitamin B12 deficiency 01/25/2017  . Obesity, Class I, BMI 30-34.9 01/05/2017  . Right shoulder pain 01/05/2017  . Hypothyroidism     Past Medical History:  Past Medical History:  Diagnosis Date  . Cardiac failure left ([place])    due to overactive thyroid  . History of chickenpox   . History of colon polyps   . History of diverticulitis   . Hypothyroidism    initial hyper    Past Surgical History:  Past Surgical History:  Procedure Laterality Date  . CHOLECYSTECTOMY    . CHOLECYSTECTOMY, LAPAROSCOPIC  2008  . COLONOSCOPY  2015   HP, TA polyps, rpt 5 yrs (Dr Julien Nordmann)  . COLONOSCOPY WITH PROPOFOL N/A 10/11/2019   SSP x2, rpt 3 yrs Vicente Males)  . DIAGNOSTIC LAPAROSCOPY    .  PLANTAR FASCIA SURGERY  2011   with a spur removed  . TONSILLECTOMY     WITH ADENOIDECTOMY  . TONSILLECTOMY AND ADENOIDECTOMY  1976  . VARICOSE VEIN SURGERY Bilateral 1999   stab phlebectomy    Past Obstetrical History:  OB History  Gravida Para Term Preterm AB Living  2 2 2     2   SAB IAB Ectopic Multiple Live Births          2    # Outcome Date GA Lbr Len/2nd Weight Sex Delivery Anes PTL Lv  2 Term 04/09/95    F Vag-Spont EPI  LIV  1 Term 04/20/93    M Vag-Spont EPI  LIV   Both approximately 7lbs and no forceps or episotomy  Past Gynecological History: As per HPI. History of Pap Smear(s): Yes.   Last pap 2020, which was negative History of HRT use: No.  Social History:  Social History   Socioeconomic History  . Marital status: Married    Spouse name: Not on file  . Number of children: Not on file  . Years of education: Not on file  . Highest education level: Not on file  Occupational History  . Not on file  Tobacco Use  . Smoking status: Never Smoker  . Smokeless tobacco: Never Used  Vaping Use  . Vaping Use: Never used  Substance and Sexual Activity  . Alcohol use: Yes    Comment: glass of wine with dinner (daily)  . Drug use: No  .  Sexual activity: Yes    Birth control/protection: Post-menopausal  Other Topics Concern  . Not on file  Social History Narrative   From [place]    Lives with husband    Occ: Marine scientist, housewife    Activity: gardening, golf. Enjoys sewing   Diet: good water, fruits/vegetables daily, enjoys cooking   Social Determinants of Radio broadcast assistant Strain: Not on file  Food Insecurity: Not on file  Transportation Needs: Not on file  Physical Activity: Not on file  Stress: Not on file  Social Connections: Not on file  Intimate Partner Violence: Not on file    Family History:  Family History  Problem Relation Age of Onset  . Hypothyroidism Mother   . Cancer Father 19       stomach  . Stroke Paternal Grandfather   .  Stroke Maternal Grandmother   . Diabetes Neg Hx   . CAD Neg Hx   . Breast cancer Neg Hx     Health Maintenance:  Mammogram(s): Yes.   Date: 2021 Colonoscopy: Yes.   Date: 2021  Medications Dagmar Adcox had no medications administered during this visit. Current Outpatient Medications  Medication Sig Dispense Refill  . cetirizine (ZYRTEC) 10 MG tablet Take 10 mg by mouth daily.    . Cholecalciferol (VITAMIN D) 50 MCG (2000 UT) CAPS Take 1 capsule (2,000 Units total) by mouth daily. 30 capsule   . ibuprofen (ADVIL,MOTRIN) 200 MG tablet Take 400 mg by mouth daily as needed.    Marland Kitchen levothyroxine (SYNTHROID) 112 MCG tablet Take 1 tablet (112 mcg total) by mouth daily. 30 tablet 6  . methocarbamol (ROBAXIN) 500 MG tablet Take 1 tablet (500 mg total) by mouth 3 (three) times daily as needed for muscle spasms (sedation precautions). 30 tablet 0  . Tetrahydrozoline HCl (EYE DROPS OP) Apply 1 drop to eye daily as needed.    . vitamin B-12 (CYANOCOBALAMIN) 1000 MCG tablet Take 1 tablet (1,000 mcg total) by mouth daily.     No current facility-administered medications for this visit.    Allergies Other  Physical Exam:  BP 137/89   Pulse 71   Ht 5\' 8"  (1.727 m)   Wt 225 lb (102.1 kg)   BMI 34.21 kg/m  Body mass index is 34.21 kg/m. General appearance: Well nourished, well developed female in no acute distress.  Cardiovascular: normal s1 and s2.  No murmurs, rubs or gallops. Respiratory:  Clear to auscultation bilateral. Normal respiratory effort Abdomen: positive bowel sounds and no masses, hernias; diffusely non tender to palpation, non distended Neuro/Psych:  Normal mood and affect.  Skin:  Warm and dry.  Lymphatic:  No inguinal lymphadenopathy.   Pelvic exam: is not limited by body habitus EGBUS: within normal limits Vagina: within normal limits and with no blood or discharge in the vault Cervix: normal appearing cervix without tenderness, discharge or lesions. Uterus:   nonenlarged and non tender Adnexa:  normal adnexa and no mass, fullness, tenderness Rectovaginal: deferred  With valsalva: Aa and Ba to -1 and cervix to -4 to -4.5. no posterior vault prolapse noted.   Laboratory: none  Radiology: none  Assessment: pt stable  Plan:  1. Cystocele with prolapse I told her that I don't feel that she needs surgery and would benefit from pelvic floor PT +/- a pessary, but I feel that starting with PT alone is fine and reassess in 6 months and she is amenable to this - Ambulatory referral to Physical Therapy  Orders Placed  This Encounter  Procedures  . Ambulatory referral to Physical Therapy    RTC 6 months for repeat exam   Durene Romans MD Attending [facility])

## 2021-02-18 NOTE — Progress Notes (Signed)
Dec 2021 last pap

## 2021-03-18 ENCOUNTER — Other Ambulatory Visit: Payer: Self-pay | Admitting: Family Medicine

## 2021-03-20 NOTE — Telephone Encounter (Signed)
Strength changed to 112 mcg.  New rx sent on 01/18/21, #30/6.

## 2021-03-26 ENCOUNTER — Other Ambulatory Visit: Payer: Self-pay

## 2021-03-26 ENCOUNTER — Ambulatory Visit: Payer: 59 | Attending: Obstetrics and Gynecology | Admitting: Physical Therapy

## 2021-03-26 ENCOUNTER — Encounter: Payer: Self-pay | Admitting: Physical Therapy

## 2021-03-26 DIAGNOSIS — R278 Other lack of coordination: Secondary | ICD-10-CM | POA: Diagnosis present

## 2021-03-26 DIAGNOSIS — M533 Sacrococcygeal disorders, not elsewhere classified: Secondary | ICD-10-CM | POA: Insufficient documentation

## 2021-03-26 DIAGNOSIS — M2170 Unequal limb length (acquired), unspecified site: Secondary | ICD-10-CM

## 2021-03-26 NOTE — Therapy (Signed)
[place] MAIN [facility] SERVICES [address] [place], [place], [address] Phone: [phone]   Fax:  [phone]  Physical Therapy Evaluation  Patient Details  Name: Kirsten Barnes MRN: [id number] Date of Birth: 08-09-1966 Referring Provider (PT): Ilda Basset MD   Encounter Date: 03/26/2021   PT End of Session - 03/26/21 0909     Visit Number 1    Number of Visits 10    Date for PT Re-Evaluation 06/04/21    PT Start Time 0904    PT Stop Time 1000    PT Time Calculation (min) 56 min             Past Medical History:  Diagnosis Date   Cardiac failure left ([place])    due to overactive thyroid   History of chickenpox    History of colon polyps    History of diverticulitis    Hypothyroidism    initial hyper    Past Surgical History:  Procedure Laterality Date   CHOLECYSTECTOMY     CHOLECYSTECTOMY, LAPAROSCOPIC  2008   COLONOSCOPY  2015   HP, TA polyps, rpt 5 yrs (Dr Julien Nordmann)   COLONOSCOPY WITH PROPOFOL N/A 10/11/2019   SSP x2, rpt 3 yrs Vicente Males)   [place]  2011   with a spur removed   [place] Bilateral 1999   stab phlebectomy    There were no vitals filed for this visit.    Subjective Assessment - 03/26/21 0910     Subjective 1) prolapse:  pt noticed around December 2021, pt felt like a tampon was stuck in her vagina.Pt though she had a bowel prolapse but ishe was explained by gynecologist that it was a bladder prolapse.  Currently, pt feels the prolapse 75%  the time when bending,  biking, golfing, gardening.  Pt does not have to push it back up nor does not interfere with urination/ bowel movements. Pt does not strain with her daily bowel movements.  Pt has no incontinence with coughing/ laughing , sneezing.  Pt 's fitness routine: 45 min biking 2-3 x week, golf  3x month, walk 2 miles a day without a  flexibility routine.  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 Pt lifts mulch once a year, digging, tree pruning, hoeing, weeding.      2)  L LBP: pt tweaked it months ago and it got better. X ray showed arthritis. Four-five days ago, pt felt the back again with bending. It felt like "thread snapping" and "ache". She feels it with sitting. Denied radiating pain.    Pertinent History Hx of 2 vaginal deliveries without tears. Gall bladder removed. Pt has a R longer leg. Hx of plantar fasciitis L with surgery 2011    Patient Stated Goals to put the bits in place and lose weight                [facility] PT Assessment - 03/26/21 0925       Assessment   Medical Diagnosis prolapse    Referring Provider (PT) Pickens MD      Precautions   Precautions None      Restrictions   Weight Bearing Restrictions No      Balance Screen   Has the patient fallen in the past 6 months No  Observation/Other Assessments   Observations forward head/ dowagers hump    Scoliosis R convex curve lumbar, R shoulder lower      Sit to Stand   Comments genu valgus/ adducted      Strength   Overall Strength Comments BLE 5/5, B hip abd 3+/5      Palpation   SI assessment  R iliac crest higher      Ambulation/Gait   Gait velocity before shoe lift R, 1.2 m/s after heel lift in R: 1.4 m/s    Gait Comments before shoe lift R: excessive L pelvic sway, R shoulder lower, after shoe lift, reciporcal gait, levelled shoulders                        Objective measurements completed on examination: See above findings.       [facility] PT Treatment/Exercise - 03/26/21 0925       Therapeutic Activites    Other Therapeutic Activities explained structural alignment , pelvic floor function with IAP system with anatomy/ physiology explanations, body emchanics with sit to stand, lifting . Shoe lift fitting in R shoe, assessed giat      Neuro Re-ed    Neuro Re-ed  Details  cued for clam shell technique, stretches                         PT Long Term Goals - 03/26/21 1013       PT LONG TERM GOAL #1   Title Pt will decrease her lumbar FOTO score from 70 pt to > 80 pts in order to improve function and QOL    Time 10    Period Weeks    Status New    Target Date 06/04/21      PT LONG TERM GOAL #2   Title Pt will bec ompliant with shoe lift in R shoe and demo equal alignment of pelvic girdle/  less R cnovex lumbar curve in order to progress to deep core/ pelvic floor exercises    Baseline L iliacc rest higher,    Time 4    Period Weeks    Status New    Target Date 04/23/21      PT LONG TERM GOAL #3   Title Pt will demo no dyscoordination of pelvic floor with diaphragm and require no cues in order to improve her IAP system and pelvic organ support    Time 6    Period Weeks    Status New    Target Date 05/07/21      PT LONG TERM GOAL #4   Title Pt will demo increased hip abduction strength from 3+/5 to > 4/5 B and no genu valgus in sit to stand in order to minimize L LBP when getting out of chair    Time 8    Period Weeks    Status New    Target Date 05/21/21      PT LONG TERM GOAL #5   Title PT will demo IND with proper body mechanics with gardening, golfing, and other activities in order to minimzie worsening of prolapse    Time 10    Period Weeks    Status New    Target Date 06/04/21                    Plan - 03/26/21 1258     Clinical Impression Statement  Pt is a 55 yo  who presents with prolapse symptoms and L LBP which impact her QOL and ADLs.   Pt's musculoskeletal assessment revealed unequal leg length ( L higher > R),  forward head head, dyscoordination and strength of pelvic floor mm, weak hip weakness, gait deviations, and poor body mechanics which places strain on the abdominal/pelvic floor mm.   These are deficits that indicate an ineffective intraabdominal pressure system associated with  increased risk for pt's Sx.   Pt performs many gravity-loaded tasks with gardening. Pt will benefit from proper coordination training and education on functional positions in order to yield greater outcomes.   Pt was provided education on etiology of Sx with anatomy, physiology explanation with images along with the benefits of customized pelvic PT Tx based on pt's medical conditions and musculoskeletal deficits.  Explained the physiology of deep core mm coordination and roles of pelvic floor function in urination, defecation, sexual function, and postural control with deep core mm system.   Regional interdependent approaches will yield greater benefits in pt's POC due to Hx of L plantar fasciitis and surgery and untreated  L LE length discrepancy prior to today's visit.   Following Tx today which pt tolerated without complaints, pt demo'd equal alignment of pelvic girdle and improved gait pattern with shoe lift in R shoe. Pt required cues for clam shell and stretch. Plan to progress to deep core coordination and spinal mobility t session. Pt benefit from skilled PT.      Personal Factors and Comorbidities Other   engages in loaded tasks ( i.e. gardening)   Examination-Activity Limitations Sit;Squat;Toileting    Stability/Clinical Decision Making Evolving/Moderate complexity    Clinical Decision Making Moderate    Rehab Potential Good    PT Frequency 1x / week    PT Duration Other (comment)   10   PT Treatment/Interventions Moist Heat;Stair training;Neuromuscular re-education;Functional mobility training;Therapeutic activities;Patient/family education;Therapeutic exercise;Manual techniques;Taping;Balance training;Gait training;Dry needling;Biofeedback;Splinting;Cryotherapy;Joint Manipulations;Energy conservation;Scar mobilization    Consulted and Agree with Plan of Care Patient             Patient will benefit from skilled therapeutic intervention in order to improve the following  deficits and impairments:  Decreased mobility, Decreased coordination, Decreased activity tolerance, Decreased endurance, Difficulty walking, Decreased range of motion, Decreased safety awareness, Increased muscle spasms, Hypomobility, Decreased strength, Decreased scar mobility, Postural dysfunction, Improper body mechanics, Pain, Abnormal gait, Impaired flexibility  Visit Diagnosis: Unequal leg length  Sacrococcygeal disorders, not elsewhere classified  Other lack of coordination     Problem List Patient Active Problem List   Diagnosis Date Noted   Cystocele with prolapse 02/18/2021   Decreased bone density 12/28/2020   Acute midline low back pain without sciatica 12/24/2020   Prolapse of female pelvic organs 12/24/2020   Contact dermatitis due to poison ivy 03/27/2020   Recurrent epistaxis 12/28/2019   Left anterior knee pain 09/20/2018   Encounter for general adult medical examination with abnormal findings 09/17/2017   Dyslipidemia 09/17/2017   Vitamin D deficiency 01/25/2017   Vitamin B12 deficiency 01/25/2017   Obesity, Class I, BMI 30-34.9 01/05/2017   Right shoulder pain 01/05/2017   Hypothyroidism     Jerl Mina 03/26/2021, 1:03 PM  [place] MAIN [facility] SERVICES [address] [place], [place], [address] Phone: [phone]   Fax:  [phone]  Name: Kirsten Barnes MRN: [id number] Date of Birth: 07/27/1966

## 2021-03-26 NOTE — Patient Instructions (Signed)
  Proper body mechanics with getting out of a chair to decrease strain  on back &pelvic floor   Avoid holding your breath when Getting out of the chair:  Scoot to front part of chair chair Heels behind knees, feet are hip width apart, knees out,    nose over toes  Inhale like you are smelling roses Exhale to stand with slight push of hands off chair   ___   Clam Shell 45 Degrees  Lying with hips and knees bent 45, one pillow between knees and ankles. Heel together, toes apart like ballerina,  Lift knee with exhale while pressing heels together. Be sure pelvis does not roll backward. Do not arch back. Do 20 times, each leg, 2 times per day.     Complimentary stretch: Figure-4   3 breaths  * Keep pelvis levelled with tactile cue with hand under back of hips  * Slide the ankle of the supporting foot out to decrease the angle which can help level the pelvis    ___

## 2021-04-02 ENCOUNTER — Other Ambulatory Visit: Payer: Self-pay

## 2021-04-02 ENCOUNTER — Ambulatory Visit: Payer: 59 | Admitting: Physical Therapy

## 2021-04-02 DIAGNOSIS — R278 Other lack of coordination: Secondary | ICD-10-CM

## 2021-04-02 DIAGNOSIS — M533 Sacrococcygeal disorders, not elsewhere classified: Secondary | ICD-10-CM

## 2021-04-02 DIAGNOSIS — M2170 Unequal limb length (acquired), unspecified site: Secondary | ICD-10-CM

## 2021-04-02 NOTE — Patient Instructions (Signed)
  Lengthen Back rib by L  shoulder    Lie on R  side , pillow between knees and under head  Pull  arm overhead over mattress, grab the edge of mattress,pull it upward, drawing elbow away from ears  Breathing 10 reps  Open book (handout)  Lying on  _ side , rotating  __ only this week  Rotating onto pillow /yoga block  Pillow/ Block between knees  10 reps  __  Stretches :    6 directions of the spine :  5 reps   Rotation: armswings to rotate the spine  Side flexion: arm overhead arching like a rainbow , both sides  Forward bend and strainghtening up: minisquat-  Like you are about to sit and then stand up with a slight lift in the chest   Mini squat-figure 4 (piriformis) hip flexor stretch on step hip flexor twist on step    During the day:  Stretches : (Cuing provided for proper alignment6 directions 6 directions of psine   sidebend  Rotation with trunk and arm swings  minisquat ( knees behind toes) , pull arms back, rise up, chest lifts, look up   ____  Neck / shoulder stretches:    _angel wings, lower elbows down , keep arms touching bed  10 reps   ( Withheld the 6 directions spine due to nausea reported with side flexion during treatment and report of past history of vertigo and motion sickness)

## 2021-04-03 NOTE — Therapy (Signed)
[place] MAIN [facility] SERVICES [address] [place], [place], [address] Phone: [phone]   Fax:  [phone]  Physical Therapy Treatment  Patient Details  Name: Kirsten Barnes MRN: [id number] Date of Birth: 01-09-1966 Referring Provider (PT): Ilda Basset MD   Encounter Date: 04/02/2021   PT End of Session - 04/02/21 1751     Visit Number 2    Number of Visits 10    Date for PT Re-Evaluation 06/04/21    PT Start Time 1700    PT Stop Time 8101    PT Time Calculation (min) 53 min             Past Medical History:  Diagnosis Date   Cardiac failure left ([place])    due to overactive thyroid   History of chickenpox    History of colon polyps    History of diverticulitis    Hypothyroidism    initial hyper    Past Surgical History:  Procedure Laterality Date   CHOLECYSTECTOMY     CHOLECYSTECTOMY, LAPAROSCOPIC  2008   COLONOSCOPY  2015   HP, TA polyps, rpt 5 yrs (Dr Julien Nordmann)   COLONOSCOPY WITH PROPOFOL N/A 10/11/2019   SSP x2, rpt 3 yrs Vicente Males)   [place]  2011   with a spur removed   [place] Bilateral 1999   stab phlebectomy    There were no vitals filed for this visit.   Subjective Assessment - 04/02/21 1701     Subjective Pt is wearing her shoe lift and has not issues with them. Pt has not felt her prolapse at all last week    Pertinent History Hx of 2 vaginal deliveries without tears. Gall bladder removed. Pt has a R longer leg. Hx of plantar fasciitis L with surgery 2011    Patient Stated Goals to put the bits in place and lose weight                [facility] PT Assessment - 04/03/21 1301       Palpation   Spinal mobility tightness along paraspinals cervical to T/L, medial scapula                           [facility] Adult PT Treatment/Exercise - 04/03/21 1302        Therapeutic Activites    Other Therapeutic Activities educated on looser bra strap for optimal expansion of diaphragm      Neuro Re-ed    Neuro Re-ed Details  cued for thoracic and cervical mobility, propioception      Modalities   Modalities Moist Heat      Moist Heat Therapy   Moist Heat Location --   thoracic with guided relaxation     Manual Therapy   Manual therapy comments STM/MWM at paraspinals , intercostals, distraction at occiput. Immediately stopped sideflexion as pt reported feeling nausea as pt has had Hx of vertigo and motion sickness in the past. Nausea resolved once pt was placed in a neutral cervical position and did not reoccur for remainder of appt.                        PT Long Term Goals - 03/26/21 1013       PT LONG TERM GOAL #1  Title Pt will decrease her lumbar FOTO score from 70 pt to > 80 pts in order to improve function and QOL    Time 10    Period Weeks    Status New    Target Date 06/04/21      PT LONG TERM GOAL #2   Title Pt will bec ompliant with shoe lift in R shoe and demo equal alignment of pelvic girdle/  less R cnovex lumbar curve in order to progress to deep core/ pelvic floor exercises    Baseline L iliacc rest higher,    Time 4    Period Weeks    Status New    Target Date 04/23/21      PT LONG TERM GOAL #3   Title Pt will demo no dyscoordination of pelvic floor with diaphragm and require no cues in order to improve her IAP system and pelvic organ support    Time 6    Period Weeks    Status New    Target Date 05/07/21      PT LONG TERM GOAL #4   Title Pt will demo increased hip abduction strength from 3+/5 to > 4/5 B and no genu valgus in sit to stand in order to minimize L LBP when getting out of chair    Time 8    Period Weeks    Status New    Target Date 05/21/21      PT LONG TERM GOAL #5   Title PT will demo IND with proper body mechanics with gardening, golfing, and other activities in order to minimzie  worsening of prolapse    Time 10    Period Weeks    Status New    Target Date 06/04/21                   Plan - 04/02/21 1751     Clinical Impression Statement Pt demo'd more equal levelled pelvis with shoe lift and reported she did not feel her prolapse all week.  Pt still demo'd slight shoulder height difference and significant forward head posture.  Focused on these deficits to achieve a more stacked posture by applying more manual Tx along paraspinals from cervical to T/L junction and medial scapular mm.   Immediately stopped sideflexion in Tx as pt reported feeling nausea as pt has had Hx of vertigo and motion sickness in the past. Nausea resolved once pt was placed in a neutral cervical position and did not reoccur for remainder of appt. Withheld 6 directions of spine from HEP.    Provided one sided UE exercise to address higher L shoulder and plan to address slight rotational component to her spine at next session and then start with more deep core and pelvic floor training once alignment is in place.   Pt continues to benefit from skilled PT     Personal Factors and Comorbidities Other   engages in loaded tasks ( i.e. gardening)   Examination-Activity Limitations Sit;Squat;Toileting    Stability/Clinical Decision Making Evolving/Moderate complexity    Rehab Potential Good    PT Frequency 1x / week    PT Duration Other (comment)   10   PT Treatment/Interventions Moist Heat;Stair training;Neuromuscular re-education;Functional mobility training;Therapeutic activities;Patient/family education;Therapeutic exercise;Manual techniques;Taping;Balance training;Gait training;Dry needling;Biofeedback;Splinting;Cryotherapy;Joint Manipulations;Energy conservation;Scar mobilization    Consulted and Agree with Plan of Care Patient             Patient will benefit from skilled therapeutic intervention in order to improve the following  deficits and impairments:  Decreased mobility,  Decreased coordination, Decreased activity tolerance, Decreased endurance, Difficulty walking, Decreased range of motion, Decreased safety awareness, Increased muscle spasms, Hypomobility, Decreased strength, Decreased scar mobility, Postural dysfunction, Improper body mechanics, Pain, Abnormal gait, Impaired flexibility  Visit Diagnosis: Unequal leg length  Sacrococcygeal disorders, not elsewhere classified  Other lack of coordination     Problem List Patient Active Problem List   Diagnosis Date Noted   Cystocele with prolapse 02/18/2021   Decreased bone density 12/28/2020   Acute midline low back pain without sciatica 12/24/2020   Prolapse of female pelvic organs 12/24/2020   Contact dermatitis due to poison ivy 03/27/2020   Recurrent epistaxis 12/28/2019   Left anterior knee pain 09/20/2018   Encounter for general adult medical examination with abnormal findings 09/17/2017   Dyslipidemia 09/17/2017   Vitamin D deficiency 01/25/2017   Vitamin B12 deficiency 01/25/2017   Obesity, Class I, BMI 30-34.9 01/05/2017   Right shoulder pain 01/05/2017   Hypothyroidism     Jerl Mina ,PT, DPT, E-RYT  04/03/2021, 1:07 PM  [place] MAIN [facility] SERVICES [address] [place], [place], [address] Phone: [phone]   Fax:  [phone]  Name: Liset Mcmonigle MRN: [id number] Date of Birth: November 22, 1965

## 2021-04-09 ENCOUNTER — Other Ambulatory Visit: Payer: Self-pay

## 2021-04-09 ENCOUNTER — Ambulatory Visit: Payer: 59 | Attending: Obstetrics and Gynecology | Admitting: Physical Therapy

## 2021-04-09 DIAGNOSIS — M2170 Unequal limb length (acquired), unspecified site: Secondary | ICD-10-CM | POA: Diagnosis not present

## 2021-04-09 DIAGNOSIS — M533 Sacrococcygeal disorders, not elsewhere classified: Secondary | ICD-10-CM | POA: Diagnosis present

## 2021-04-09 DIAGNOSIS — R278 Other lack of coordination: Secondary | ICD-10-CM | POA: Diagnosis present

## 2021-04-09 NOTE — Patient Instructions (Signed)
Pillow under hips for deep core level 1-2  and the following :  Lying on back, knees bent    band under ballmounds  while laying on back w/ knees bent  "W" exercise  10 reps x 2 sets   Band is placed under feet, knees bent, feet are hip width apart Hold band with thumbs point out, keep upper arm and elbow touching the bed the whole time  - inhale and then exhale pull bands by bending elbows hands move in a "w"  (feel shoulder blades squeezing)    _____________________________ __________________________  Oblique/ scapula stabilization   Opposite arm   Place band in "U"    band under ballmounds  while laying on back w/ knees bent     20 reps  on each side  Holding band from opposite thigh,  Inhale,    exhale then pull band across body while keeping elbow , shoulders, back of the head pressed down    ______________

## 2021-04-09 NOTE — Therapy (Signed)
[place] MAIN [facility] SERVICES [address] [place], [place], [address] Phone: [phone]   Fax:  [phone]  Physical Therapy Treatment  Patient Details  Name: Kirsten Barnes MRN: [id number] Date of Birth: 1966-03-27 Referring Provider (PT): Pickens MD   Encounter Date: 04/09/2021   PT End of Session - 04/09/21 1753     Visit Number 3    Number of Visits 10    Date for PT Re-Evaluation 06/04/21    PT Start Time 1700    PT Stop Time 1800    PT Time Calculation (min) 60 min             Past Medical History:  Diagnosis Date   Cardiac failure left ([place])    due to overactive thyroid   History of chickenpox    History of colon polyps    History of diverticulitis    Hypothyroidism    initial hyper    Past Surgical History:  Procedure Laterality Date   CHOLECYSTECTOMY     CHOLECYSTECTOMY, LAPAROSCOPIC  2008   COLONOSCOPY  2015   HP, TA polyps, rpt 5 yrs (Dr Julien Nordmann)   COLONOSCOPY WITH PROPOFOL N/A 10/11/2019   SSP x2, rpt 3 yrs Vicente Males)   [place]  2011   with a spur removed   [place] Bilateral 1999   stab phlebectomy    There were no vitals filed for this visit.   Subjective Assessment - 04/09/21 1703     Subjective Pt noticed no prolapse with biking 30 miles, bending , gardening. Pt reported her nausea was triggered when she did the neck exercises. Pt also gets triggered when she went boxing with her dtr one time and noticed the repeated head turns caused the nausea. Previously she has had it happen when she did exercises on the floor.    Pertinent History Hx of 2 vaginal deliveries without tears. Gall bladder removed. Pt has a R longer leg. Hx of plantar fasciitis L with surgery 2011    Patient Stated Goals to put the bits in place and lose weight                [facility] PT Assessment -  04/09/21 1811       Coordination   Coordination and Movement Description perturbation of trunk                        Pelvic Floor Special Questions - 04/09/21 1812     Prolapse Posterior Wall;Uterine   inside introitus, standing, coughing cue ( posterior prolapse still inside introitus)   Pelvic Floor Internal Exam pt consented verbally with no contraindications    Exam Type Vaginal    Palpation tightness at puborectalis anterior attachment behind pubic symphysis    Strength fair squeeze, definite lift   after cue for anterior tilt of pelvis              [facility] Adult PT Treatment/Exercise - 04/09/21 1800       Neuro Re-ed    Neuro Re-ed Details  cued for pelvic tilt anterior and pelvic floor contraction, deep core level 2, cervico-scapular strengthening      Manual Therapy   Internal Pelvic Floor STM/MWM at problem areas noted in assessment  PT Long Term Goals - 03/26/21 1013       PT LONG TERM GOAL #1   Title Pt will decrease her lumbar FOTO score from 70 pt to > 80 pts in order to improve function and QOL    Time 10    Period Weeks    Status New    Target Date 06/04/21      PT LONG TERM GOAL #2   Title Pt will bec ompliant with shoe lift in R shoe and demo equal alignment of pelvic girdle/  less R cnovex lumbar curve in order to progress to deep core/ pelvic floor exercises    Baseline L iliacc rest higher,    Time 4    Period Weeks    Status New    Target Date 04/23/21      PT LONG TERM GOAL #3   Title Pt will demo no dyscoordination of pelvic floor with diaphragm and require no cues in order to improve her IAP system and pelvic organ support    Time 6    Period Weeks    Status New    Target Date 05/07/21      PT LONG TERM GOAL #4   Title Pt will demo increased hip abduction strength from 3+/5 to > 4/5 B and no genu valgus in sit to stand in order to minimize L LBP when getting out of chair    Time 8     Period Weeks    Status New    Target Date 05/21/21      PT LONG TERM GOAL #5   Title PT will demo IND with proper body mechanics with gardening, golfing, and other activities in order to minimzie worsening of prolapse    Time 10    Period Weeks    Status New    Target Date 06/04/21                   Plan - 04/09/21 1818     Clinical Impression Statement Withheld manual Tx to cervical spine as pt reported she experienced nausea that persisted for days afterwards. Pt has a Hx of motion sickness and has experienced nausea with boxing movements when she took a class with her dtr in the past and with doing exercises on the floor. With this consideration, provided pt resistance band exercises to help minimzie forward head posture with cervicoscapulothoracic strengthening and no neck motions involved. Pt reported no NA today with exercises.   Continued today with assessment of pelvic floor and prolapse in standing and hooking position and applied manual Tx to minimize anterior mm tightness and cued for anterior tilt of pelvic floor.  Pt was provided a pillow under hips for exercises deep core and cued for technique to promote more upward movement of pelvic organs. Pt continues to benefit from skilled PT.    Personal Factors and Comorbidities Other   engages in loaded tasks ( i.e. gardening)   Examination-Activity Limitations Sit;Squat;Toileting    Stability/Clinical Decision Making Evolving/Moderate complexity    Rehab Potential Good    PT Frequency 1x / week    PT Duration Other (comment)   10   PT Treatment/Interventions Moist Heat;Stair training;Neuromuscular re-education;Functional mobility training;Therapeutic activities;Patient/family education;Therapeutic exercise;Manual techniques;Taping;Balance training;Gait training;Dry needling;Biofeedback;Splinting;Cryotherapy;Joint Manipulations;Energy conservation;Scar mobilization    Consulted and Agree with Plan of Care Patient              Patient will benefit from skilled therapeutic intervention in order to improve the  following deficits and impairments:  Decreased mobility, Decreased coordination, Decreased activity tolerance, Decreased endurance, Difficulty walking, Decreased range of motion, Decreased safety awareness, Increased muscle spasms, Hypomobility, Decreased strength, Decreased scar mobility, Postural dysfunction, Improper body mechanics, Pain, Abnormal gait, Impaired flexibility  Visit Diagnosis: Unequal leg length  Sacrococcygeal disorders, not elsewhere classified  Other lack of coordination     Problem List Patient Active Problem List   Diagnosis Date Noted   Cystocele with prolapse 02/18/2021   Decreased bone density 12/28/2020   Acute midline low back pain without sciatica 12/24/2020   Prolapse of female pelvic organs 12/24/2020   Contact dermatitis due to poison ivy 03/27/2020   Recurrent epistaxis 12/28/2019   Left anterior knee pain 09/20/2018   Encounter for general adult medical examination with abnormal findings 09/17/2017   Dyslipidemia 09/17/2017   Vitamin D deficiency 01/25/2017   Vitamin B12 deficiency 01/25/2017   Obesity, Class I, BMI 30-34.9 01/05/2017   Right shoulder pain 01/05/2017   Hypothyroidism     Jerl Mina ,PT, DPT, E-RYT  04/09/2021, 6:19 PM  [place] MAIN [facility] SERVICES [address] [place], [place], [address] Phone: [phone]   Fax:  [phone]  Name: Kirsten Barnes MRN: [id number] Date of Birth: 1965/12/28

## 2021-04-16 ENCOUNTER — Ambulatory Visit: Payer: 59 | Admitting: Physical Therapy

## 2021-04-16 ENCOUNTER — Other Ambulatory Visit: Payer: Self-pay

## 2021-04-16 DIAGNOSIS — M533 Sacrococcygeal disorders, not elsewhere classified: Secondary | ICD-10-CM

## 2021-04-16 DIAGNOSIS — M2170 Unequal limb length (acquired), unspecified site: Secondary | ICD-10-CM | POA: Diagnosis not present

## 2021-04-16 DIAGNOSIS — R278 Other lack of coordination: Secondary | ICD-10-CM

## 2021-04-16 NOTE — Therapy (Signed)
[place] MAIN [facility] SERVICES [address] [place], [place], [address] Phone: [phone]   Fax:  [phone]  Physical Therapy Treatment  Patient Details  Name: Kirsten Barnes MRN: [id number] Date of Birth: 06/26/1966 Referring Provider (PT): Ilda Basset MD   Encounter Date: 04/16/2021   PT End of Session - 04/16/21 1802     Visit Number 4    Number of Visits 10    Date for PT Re-Evaluation 06/04/21    PT Start Time 1706    PT Stop Time 1802    PT Time Calculation (min) 56 min             Past Medical History:  Diagnosis Date   Cardiac failure left ([place])    due to overactive thyroid   History of chickenpox    History of colon polyps    History of diverticulitis    Hypothyroidism    initial hyper    Past Surgical History:  Procedure Laterality Date   CHOLECYSTECTOMY     CHOLECYSTECTOMY, LAPAROSCOPIC  2008   COLONOSCOPY  2015   HP, TA polyps, rpt 5 yrs (Dr Julien Nordmann)   COLONOSCOPY WITH PROPOFOL N/A 10/11/2019   SSP x2, rpt 3 yrs Vicente Males)   [place]  2011   with a spur removed   [place] Bilateral 1999   stab phlebectomy    There were no vitals filed for this visit.   Subjective Assessment - 04/16/21 1812     Subjective Pt noticed more prolapse after she practiced the HEP and gardening    Pertinent History Hx of 2 vaginal deliveries without tears. Gall bladder removed. Pt has a R longer leg. Hx of plantar fasciitis L with surgery 2011    Patient Stated Goals to put the bits in place and lose weight                [facility] PT Assessment - 04/16/21 1757       Coordination   Coordination and Movement Description excessive overuse of oblique mm in deep core      Floor to Stand   Comments poor mechanics with simulated gardening positions                        Pelvic Floor  Special Questions - 04/16/21 1757     External Perineal Exam through clothing: tightness at supra pubic area R > L               [facility] Adult PT Treatment/Exercise - 04/16/21 1758       Therapeutic Activites    Other Therapeutic Activities education and demonstrstation of gardening body emchanics to minimize straining pelvic floor      Neuro Re-ed    Neuro Re-ed Details  cued for childs pose rocking, anterior tilt of pelvis, less oblique overuse with deep core      Manual Therapy   Internal Pelvic Floor STM/MWM at supra pubic area                         PT Long Term Goals - 03/26/21 1013       PT LONG TERM GOAL #1   Title Pt will decrease her lumbar FOTO score from 70 pt to > 80 pts in order to improve  function and QOL    Time 10    Period Weeks    Status New    Target Date 06/04/21      PT LONG TERM GOAL #2   Title Pt will bec ompliant with shoe lift in R shoe and demo equal alignment of pelvic girdle/  less R cnovex lumbar curve in order to progress to deep core/ pelvic floor exercises    Baseline L iliacc rest higher,    Time 4    Period Weeks    Status New    Target Date 04/23/21      PT LONG TERM GOAL #3   Title Pt will demo no dyscoordination of pelvic floor with diaphragm and require no cues in order to improve her IAP system and pelvic organ support    Time 6    Period Weeks    Status New    Target Date 05/07/21      PT LONG TERM GOAL #4   Title Pt will demo increased hip abduction strength from 3+/5 to > 4/5 B and no genu valgus in sit to stand in order to minimize L LBP when getting out of chair    Time 8    Period Weeks    Status New    Target Date 05/21/21      PT LONG TERM GOAL #5   Title PT will demo IND with proper body mechanics with gardening, golfing, and other activities in order to minimzie worsening of prolapse    Time 10    Period Weeks    Status New    Target Date 06/04/21                   Plan - 04/16/21  1802     Clinical Impression Statement Pt required review of correct form of deep core with cues to minimize overuse of oblique mm and optimize pelvic floor mm. Manual Tx helped to minimize tightness at supra pubic mm which pt tolerated without complaints. Pt demo'd improved propioception with anterior pelvic tilt after Tx and cueing. Pt required cues for body mechanics education and demonstration of gardening body mechanics to minimize straining pelvic floor. Anticipate pt's newly gained awareness with proper techniques will help minimize prolapse Sx. Pt continues to benefit from skilled PT   Personal Factors and Comorbidities Other   engages in loaded tasks ( i.e. gardening)   Examination-Activity Limitations Sit;Squat;Toileting    Stability/Clinical Decision Making Evolving/Moderate complexity    Rehab Potential Good    PT Frequency 1x / week    PT Duration Other (comment)   10   PT Treatment/Interventions Moist Heat;Stair training;Neuromuscular re-education;Functional mobility training;Therapeutic activities;Patient/family education;Therapeutic exercise;Manual techniques;Taping;Balance training;Gait training;Dry needling;Biofeedback;Splinting;Cryotherapy;Joint Manipulations;Energy conservation;Scar mobilization    Consulted and Agree with Plan of Care Patient             Patient will benefit from skilled therapeutic intervention in order to improve the following deficits and impairments:  Decreased mobility, Decreased coordination, Decreased activity tolerance, Decreased endurance, Difficulty walking, Decreased range of motion, Decreased safety awareness, Increased muscle spasms, Hypomobility, Decreased strength, Decreased scar mobility, Postural dysfunction, Improper body mechanics, Pain, Abnormal gait, Impaired flexibility  Visit Diagnosis: Unequal leg length  Sacrococcygeal disorders, not elsewhere classified  Other lack of coordination     Problem List Patient Active Problem  List   Diagnosis Date Noted   Cystocele with prolapse 02/18/2021   Decreased bone density 12/28/2020   Acute midline low back pain without sciatica  12/24/2020   Prolapse of female pelvic organs 12/24/2020   Contact dermatitis due to poison ivy 03/27/2020   Recurrent epistaxis 12/28/2019   Left anterior knee pain 09/20/2018   Encounter for general adult medical examination with abnormal findings 09/17/2017   Dyslipidemia 09/17/2017   Vitamin D deficiency 01/25/2017   Vitamin B12 deficiency 01/25/2017   Obesity, Class I, BMI 30-34.9 01/05/2017   Right shoulder pain 01/05/2017   Hypothyroidism     Jerl Mina ,PT, DPT, E-RYT  04/16/2021, 6:12 PM  [place] MAIN [facility] SERVICES [address] [place], [place], [address] Phone: [phone]   Fax:  [phone]  Name: Kirsten Barnes MRN: [id number] Date of Birth: 03-27-1966

## 2021-04-16 NOTE — Patient Instructions (Signed)
childs poses rocking    Toes tucked, shoulders down and back, on forearms , hands shoulder width apart  10 reps   __  This deep core exercise  Less effort with breathing,  Hand on low belly and feel it lower first on exhale,  NATURAL movement vs Forced MOVEMENT   ___  Gardening with sitting stool   h   Transition from standing to floor :  stand to floor transfer :      _ slow     _ mini squat      _ crawl down with one hand on thigh      _downward dog  - >  shoulders down and back-  walk the dog ( knee bents to lengthe hamstrings)      Floor to stand :   downward dog   crawl hands back, butt is back, knees behind toes -> squat  Hands at waist , elbows back, chest lifts

## 2021-04-23 ENCOUNTER — Ambulatory Visit: Payer: 59 | Admitting: Physical Therapy

## 2021-04-23 ENCOUNTER — Other Ambulatory Visit: Payer: Self-pay

## 2021-04-23 DIAGNOSIS — R278 Other lack of coordination: Secondary | ICD-10-CM

## 2021-04-23 DIAGNOSIS — M533 Sacrococcygeal disorders, not elsewhere classified: Secondary | ICD-10-CM

## 2021-04-23 DIAGNOSIS — M2170 Unequal limb length (acquired), unspecified site: Secondary | ICD-10-CM

## 2021-04-23 NOTE — Therapy (Signed)
[place] MAIN [facility] SERVICES [address] [place], [place], [address] Phone: [phone]   Fax:  [phone]  Physical Therapy Treatment  Patient Details  Name: Kirsten Barnes MRN: [id number] Date of Birth: [birth year] Referring Provider (PT): Ilda Basset MD   Encounter Date: 04/23/2021   PT End of Session - 04/23/21 1708     Visit Number 5    Number of Visits 10    Date for PT Re-Evaluation 06/04/21    PT Start Time 1703    PT Stop Time 1800    PT Time Calculation (min) 57 min    Activity Tolerance Patient tolerated treatment well             Past Medical History:  Diagnosis Date   Cardiac failure left ([place])    due to overactive thyroid   History of chickenpox    History of colon polyps    History of diverticulitis    Hypothyroidism    initial hyper    Past Surgical History:  Procedure Laterality Date   CHOLECYSTECTOMY     CHOLECYSTECTOMY, LAPAROSCOPIC  2008   COLONOSCOPY  2015   HP, TA polyps, rpt 5 yrs (Dr Julien Nordmann)   COLONOSCOPY WITH PROPOFOL N/A 10/11/2019   SSP x2, rpt 3 yrs Vicente Males)   [place]  2011   with a spur removed   [place] Bilateral 1999   stab phlebectomy    There were no vitals filed for this visit.   Subjective Assessment - 04/23/21 1704     Subjective Pt noticed the prolapse is not feeling like a tampon anymore. Pt only had to push up her blader once this week compared to 2-3 x week.  She still can feel it and it bothers her. Cross over band exercises causes clicking shoulder on R when on her back  but not when standing.    Pertinent History Hx of 2 vaginal deliveries without tears. Gall bladder removed. Pt has a R longer leg. Hx of plantar fasciitis L with surgery 2011    Patient Stated Goals to put the bits in place and lose weight                [facility] PT Assessment  - 04/23/21 1757       Observation/Other Assessments   Observations performed PNF exercise: pt demo'd poor stabililzation of R shoulder and clicking was associated. When manual depression/stabilization in PNF exercise didnot cause clicking R shoulder      Palpation   Spinal mobility tightness at scalenes/upper trap, levator R                        Pelvic Floor Special Questions - 04/23/21 1736     Prolapse Anterior Wall   hooklying behind pubic symphysis   Pelvic Floor Internal Exam pt consented verbally with no contraindications    Exam Type Vaginal    Palpation tightness at puborectalis anterior attachments    Strength fair squeeze, definite lift   3-/5 after cues for feet co-activation              [facility] Adult PT Treatment/Exercise - 04/23/21 1737       Neuro Re-ed    Neuro Re-ed Details  cued for feet co-activation to increase pelvic floor upward activation  Moist Heat Therapy   Number Minutes Moist Heat 5 Minutes    Moist Heat Location Other (comment)   perineum through sheets, buterfly pose to lengthen pelvic floor     Manual Therapy   Manual therapy comments depression of clavicle, STM/MWM at upper trap, levator, SCM R    Internal Pelvic Floor STM/MWM at anterior puborrectalis                         PT Long Term Goals - 03/26/21 1013       PT LONG TERM GOAL #1   Title Pt will decrease her lumbar FOTO score from 70 pt to > 80 pts in order to improve function and QOL    Time 10    Period Weeks    Status New    Target Date 06/04/21      PT LONG TERM GOAL #2   Title Pt will bec ompliant with shoe lift in R shoe and demo equal alignment of pelvic girdle/  less R cnovex lumbar curve in order to progress to deep core/ pelvic floor exercises    Baseline L iliacc rest higher,    Time 4    Period Weeks    Status New    Target Date 04/23/21      PT LONG TERM GOAL #3   Title Pt will demo no dyscoordination of pelvic floor with  diaphragm and require no cues in order to improve her IAP system and pelvic organ support    Time 6    Period Weeks    Status New    Target Date 05/07/21      PT LONG TERM GOAL #4   Title Pt will demo increased hip abduction strength from 3+/5 to > 4/5 B and no genu valgus in sit to stand in order to minimize L LBP when getting out of chair    Time 8    Period Weeks    Status New    Target Date 05/21/21      PT LONG TERM GOAL #5   Title PT will demo IND with proper body mechanics with gardening, golfing, and other activities in order to minimzie worsening of prolapse    Time 10    Period Weeks    Status New    Target Date 06/04/21                   Plan - 04/23/21 1708     Clinical Impression Statement Pt noticed the prolapse is not feeling like a tampon anymore. Pt only had to push up her blader once this week compared to 2-3 x week.   Pt no longer has tight posterior pelvic floor mm and is able to perform anterior tilt of pelvis without cues.  Today pt demo'd decreased tightness of anterior mm and elicited upward movement of pelvic floor with cues required for feet co-activation.  Withholding kegels until next session .  Also applied manual Tx to minmize R shoulder and neck mm. After wards, when pt performed PNF exercise: pt demo'd poor stabililzation of R shoulder and clicking was associated. When manual depression/stabilization in PNF exercise there was no clicking R shoulder. Pt was able to perform exercise  with cues for more stabilization of R shoulder and R shoulder did not click. This verified pt can continue to perform this exercise.    Pt continues to benefit from skilled PT   Personal Factors and Comorbidities Other  engages in loaded tasks ( i.e. gardening)   Examination-Activity Limitations Sit;Squat;Toileting    Stability/Clinical Decision Making Evolving/Moderate complexity    Rehab Potential Good    PT Frequency 1x / week    PT Duration Other (comment)    10   PT Treatment/Interventions Moist Heat;Stair training;Neuromuscular re-education;Functional mobility training;Therapeutic activities;Patient/family education;Therapeutic exercise;Manual techniques;Taping;Balance training;Gait training;Dry needling;Biofeedback;Splinting;Cryotherapy;Joint Manipulations;Energy conservation;Scar mobilization    Consulted and Agree with Plan of Care Patient             Patient will benefit from skilled therapeutic intervention in order to improve the following deficits and impairments:  Decreased mobility, Decreased coordination, Decreased activity tolerance, Decreased endurance, Difficulty walking, Decreased range of motion, Decreased safety awareness, Increased muscle spasms, Hypomobility, Decreased strength, Decreased scar mobility, Postural dysfunction, Improper body mechanics, Pain, Abnormal gait, Impaired flexibility  Visit Diagnosis: Sacrococcygeal disorders, not elsewhere classified  Unequal leg length  Other lack of coordination     Problem List Patient Active Problem List   Diagnosis Date Noted   Cystocele with prolapse 02/18/2021   Decreased bone density 12/28/2020   Acute midline low back pain without sciatica 12/24/2020   Prolapse of female pelvic organs 12/24/2020   Contact dermatitis due to poison ivy 03/27/2020   Recurrent epistaxis 12/28/2019   Left anterior knee pain 09/20/2018   Encounter for general adult medical examination with abnormal findings 09/17/2017   Dyslipidemia 09/17/2017   Vitamin D deficiency 01/25/2017   Vitamin B12 deficiency 01/25/2017   Obesity, Class I, BMI 30-34.9 01/05/2017   Right shoulder pain 01/05/2017   Hypothyroidism     Jerl Mina ,PT, DPT, E-RYT  04/23/2021, 18:00  [facility] MAIN [facility] SERVICES [place], [place], [address] Phone: [phone]   Fax:  [phone]  Name: Kirsten Barnes MRN: [id number] Date of Birth:  13-Oct-1965

## 2021-04-23 NOTE — Patient Instructions (Addendum)
  _wall stretch Clock stretch with head turns :  stand perpendicular to the wall, L side to the wall Tilt head to wall  Place L palm at 7 o clock Chin tuck like you are looking into armpit Look at "[place] on giant map  Swivel head with chin tucked to look in upper corner of ceiling as if you are look at  [place] on giant map "   5 reps   Switch direction, R palm on wall at 5 o 'clock   Chin tuck like you are looking into armpit Look at "[place]  on giant map  Swivel head with chin tucked to look in upper corner of ceiling as if you are look at  [facility] on giant map "   5 reps    ___   Feet reset in Deep core level 1 2

## 2021-04-30 ENCOUNTER — Other Ambulatory Visit: Payer: Self-pay

## 2021-04-30 ENCOUNTER — Ambulatory Visit: Payer: 59 | Admitting: Physical Therapy

## 2021-04-30 DIAGNOSIS — M533 Sacrococcygeal disorders, not elsewhere classified: Secondary | ICD-10-CM

## 2021-04-30 DIAGNOSIS — M2170 Unequal limb length (acquired), unspecified site: Secondary | ICD-10-CM | POA: Diagnosis not present

## 2021-04-30 DIAGNOSIS — R278 Other lack of coordination: Secondary | ICD-10-CM

## 2021-04-30 NOTE — Therapy (Signed)
[place] MAIN [facility] SERVICES [address] [place], [place], [address] Phone: [phone]   Fax:  [phone]  Physical Therapy Treatment  Patient Details  Name: Kirsten Barnes MRN: [id number] Date of Birth: 27-May-1966 Referring Provider (PT): Pickens MD   Encounter Date: 04/30/2021   PT End of Session - 04/30/21 1408     Visit Number 6    Number of Visits 10    Date for PT Re-Evaluation 06/04/21    PT Start Time 1310    PT Stop Time 1420    PT Time Calculation (min) 70 min    Activity Tolerance Patient tolerated treatment well             Past Medical History:  Diagnosis Date   Cardiac failure left ([place])    due to overactive thyroid   History of chickenpox    History of colon polyps    History of diverticulitis    Hypothyroidism    initial hyper    Past Surgical History:  Procedure Laterality Date   CHOLECYSTECTOMY     CHOLECYSTECTOMY, LAPAROSCOPIC  2008   COLONOSCOPY  2015   HP, TA polyps, rpt 5 yrs (Dr Julien Nordmann)   COLONOSCOPY WITH PROPOFOL N/A 10/11/2019   SSP x2, rpt 3 yrs ([place])   [place]  2011   with a spur removed   [place] Bilateral 1999   stab phlebectomy    There were no vitals filed for this visit.   Subjective Assessment - 04/30/21 1310     Subjective Pt noticed the R shoulder is feeling loser but teh R shoulder still clicks at random times. Pt feels her prolapse is better by 50% as she is not feeling it as constant.    Pertinent History Hx of 2 vaginal deliveries without tears. Gall bladder removed. Pt has a R longer leg. Hx of plantar fasciitis L with surgery 2011    Patient Stated Goals to put the bits in place and lose weight                [facility] PT Assessment - 04/30/21 1409       Observation/Other Assessments   Observations poor pivot on ballmounds ( genu  valgus) lf swing      Functional Tests   Functional tests Other      Other:   Other/ Comments no clicking of shoulder in band exer ise after manual Tx      Palpation   Palpation comment tightness at T5-7 intercostals, teres minor, SA on R                           [facility] Adult PT Treatment/Exercise - 04/30/21 1412       Neuro Re-ed    Neuro Re-ed Details  cued for cervical /scapular retraction/ alignment and golf swing strethces      Moist Heat Therapy   Number Minutes Moist Heat 5 Minutes    Moist Heat Location --   R UE quadrant     Manual Therapy   Internal Pelvic Floor STM/MWM at problem area noted in assessment                         PT Long Term Goals - 03/26/21 1013  PT LONG TERM GOAL #1   Title Pt will decrease her lumbar FOTO score from 70 pt to > 80 pts in order to improve function and QOL    Time 10    Period Weeks    Status New    Target Date 06/04/21      PT LONG TERM GOAL #2   Title Pt will bec ompliant with shoe lift in R shoe and demo equal alignment of pelvic girdle/  less R cnovex lumbar curve in order to progress to deep core/ pelvic floor exercises    Baseline L iliacc rest higher,    Time 4    Period Weeks    Status New    Target Date 04/23/21      PT LONG TERM GOAL #3   Title Pt will demo no dyscoordination of pelvic floor with diaphragm and require no cues in order to improve her IAP system and pelvic organ support    Time 6    Period Weeks    Status New    Target Date 05/07/21      PT LONG TERM GOAL #4   Title Pt will demo increased hip abduction strength from 3+/5 to > 4/5 B and no genu valgus in sit to stand in order to minimize L LBP when getting out of chair    Time 8    Period Weeks    Status New    Target Date 05/21/21      PT LONG TERM GOAL #5   Title PT will demo IND with proper body mechanics with gardening, golfing, and other activities in order to minimzie worsening of prolapse    Time  10    Period Weeks    Status New    Target Date 06/04/21                   Plan - 04/30/21 1409     Clinical Impression Statement Pt required assessment of R upper quadrant as pt reported clicking in her R shoulder with past HEP and other activities. Pt had past injury to shoulder.  Pt also had showed asymmetrical alignment of shoulder and protracted R scapula/ dyskinesis which is likely related to playing golf. Pt demo'd increased mobility at upper Q post Tx and required  cues for cervical /scapular retraction/ alignment and golf swing stretches. Pt demo'd nomore clicking of shoulder post Tx and can resume past HEP but was provided a downgrade of resistance band from green to red to ensure no issues with R shoulder with PNF exercise.  Pt continues to benefit fromskilled PT    Personal Factors and Comorbidities Other   engages in loaded tasks ( i.e. gardening)   Examination-Activity Limitations Sit;Squat;Toileting    Stability/Clinical Decision Making Evolving/Moderate complexity    Rehab Potential Good    PT Frequency 1x / week    PT Duration Other (comment)   10   PT Treatment/Interventions Moist Heat;Stair training;Neuromuscular re-education;Functional mobility training;Therapeutic activities;Patient/family education;Therapeutic exercise;Manual techniques;Taping;Balance training;Gait training;Dry needling;Biofeedback;Splinting;Cryotherapy;Joint Manipulations;Energy conservation;Scar mobilization    Consulted and Agree with Plan of Care Patient             Patient will benefit from skilled therapeutic intervention in order to improve the following deficits and impairments:  Decreased mobility, Decreased coordination, Decreased activity tolerance, Decreased endurance, Difficulty walking, Decreased range of motion, Decreased safety awareness, Increased muscle spasms, Hypomobility, Decreased strength, Decreased scar mobility, Postural dysfunction, Improper body mechanics, Pain,  Abnormal gait, Impaired flexibility  Visit Diagnosis: Sacrococcygeal disorders, not elsewhere classified  Unequal leg length  Other lack of coordination     Problem List Patient Active Problem List   Diagnosis Date Noted   Cystocele with prolapse 02/18/2021   Decreased bone density 12/28/2020   Acute midline low back pain without sciatica 12/24/2020   Prolapse of female pelvic organs 12/24/2020   Contact dermatitis due to poison ivy 03/27/2020   Recurrent epistaxis 12/28/2019   Left anterior knee pain 09/20/2018   Encounter for general adult medical examination with abnormal findings 09/17/2017   Dyslipidemia 09/17/2017   Vitamin D deficiency 01/25/2017   Vitamin B12 deficiency 01/25/2017   Obesity, Class I, BMI 30-34.9 01/05/2017   Right shoulder pain 01/05/2017   Hypothyroidism     Jerl Mina ,PT, DPT, E-RYT  04/30/2021, 3:13 PM  [place] MAIN [facility] SERVICES [address] [place], [place], [address] Phone: [phone]   Fax:  [phone]  Name: Kirsten Barnes MRN: [id number] Date of Birth: 1966-01-10

## 2021-04-30 NOTE — Patient Instructions (Signed)
Practice golf stretch to strethc opposite side   Ballmounds to pivot to minimize knee injuries   __  Mini low cobras:  Toes turned each other palms under armpit, elbows by ribs (like cricket wings),  imaginary pencil held in armpit,   Inhale,  Exhale press through palms to bed/floor and small lift of chest only not the chin. You will feel the bend in the midback not your low back   5 reps   __  Locust pose  Pillow under hips if needed for decreased low back pain  Palms face midline by hips  Finger tips shooting straight down  Imagine holding pencil under your armpits Draw shoulders away from ears Inhale Exhale lift chest up slightly without feeling it in your back. The bend happens in the midback  (keep chin tucked)

## 2021-05-06 ENCOUNTER — Ambulatory Visit: Payer: BC Managed Care – PPO | Admitting: Physical Therapy

## 2021-05-07 ENCOUNTER — Ambulatory Visit: Payer: BC Managed Care – PPO | Admitting: Physical Therapy

## 2021-05-07 ENCOUNTER — Other Ambulatory Visit: Payer: Self-pay

## 2021-05-07 ENCOUNTER — Ambulatory Visit: Payer: BC Managed Care – PPO | Attending: Obstetrics and Gynecology | Admitting: Physical Therapy

## 2021-05-07 DIAGNOSIS — R278 Other lack of coordination: Secondary | ICD-10-CM | POA: Diagnosis not present

## 2021-05-07 DIAGNOSIS — M2170 Unequal limb length (acquired), unspecified site: Secondary | ICD-10-CM | POA: Diagnosis not present

## 2021-05-07 DIAGNOSIS — M533 Sacrococcygeal disorders, not elsewhere classified: Secondary | ICD-10-CM | POA: Diagnosis not present

## 2021-05-07 NOTE — Patient Instructions (Signed)
Lunge position R foot forward, 10 reps of triceps ( face away from door)   10 reps with other foot forward   ___  Stand balanced between heels and ballmounds Wide breath not up into shoulder String pulling you up   ___  Hold off on the back exercise with red band ( W exercise)  ____  [employer] with breast strokes   With noodles under neck, midback, knees   - angel wings and legs apart

## 2021-05-07 NOTE — Therapy (Signed)
[place] MAIN [facility] SERVICES [address] [place], [place], [address] Phone: [phone]   Fax:  [phone]  Physical Therapy Treatment  Patient Details  Name: Kirsten Barnes MRN: [id number] Date of Birth: 1966-04-10 Referring Provider (PT): Pickens MD   Encounter Date: 05/07/2021   PT End of Session - 05/07/21 1019     Visit Number 7    Number of Visits 10    Date for PT Re-Evaluation 06/04/21    PT Start Time 0904    PT Stop Time 1000    PT Time Calculation (min) 56 min    Activity Tolerance Patient tolerated treatment well             Past Medical History:  Diagnosis Date   Cardiac failure left ([place])    due to overactive thyroid   History of chickenpox    History of colon polyps    History of diverticulitis    Hypothyroidism    initial hyper    Past Surgical History:  Procedure Laterality Date   CHOLECYSTECTOMY     CHOLECYSTECTOMY, LAPAROSCOPIC  2008   COLONOSCOPY  2015   HP, TA polyps, rpt 5 yrs (Dr Julien Nordmann)   COLONOSCOPY WITH PROPOFOL N/A 10/11/2019   SSP x2, rpt 3 yrs Vicente Males)   [place]  2011   with a spur removed   [place] Bilateral 1999   stab phlebectomy    There were no vitals filed for this visit.   Subjective Assessment - 05/07/21 1018     Subjective Pt reported her shoulder still clicks even with the red band in the exercise on her back. Pt started to swim breaststroke and has not focused as much on HEP. Pt has been a swimmer all her life. Pt woke up Saturday morning ( 3 days ago) with muscle pull by R breast rib area. Pt feels she must have slept wrong and swimming loosened it and today is better. When she takes a deep breath in, she can feel it pull.  Pt has not felt her prolapse as much    Pertinent History Hx of 2 vaginal deliveries without tears. Gall bladder removed. Pt  has a R longer leg. Hx of plantar fasciitis L with surgery 2011    Patient Stated Goals to put the bits in place and lose weight                [facility] PT Assessment - 05/07/21 1019       Posture/Postural Control   Posture Comments posterior COM, heel Wbings, hyperextended knees      Strength   Overall Strength Comments R ER/IR, ext, triceps weaker than L UE ( preTx),  post Tx:  ext/ tricepts still weak on R UE      Palpation   Spinal mobility tightness at medial/lower scap L, R low ribs    Palpation comment shoulder adduction with no more scapula inferior angle winging R with no clicking                           [facility] Adult PT Treatment/Exercise - 05/07/21 1041       Neuro Re-ed    Neuro Re-ed Details  Cued for tricep strengthening with green band, cued for posture to engage deep core  Moist Heat Therapy   Number Minutes Moist Heat 5 Minutes    Moist Heat Location --   L scapula , supported, reclined stretch to lengthen L flank, rotae R rib     Manual Therapy   Internal Pelvic Floor STM/MWM at problem area noted in assessment                         PT Long Term Goals - 05/07/21 1113       PT LONG TERM GOAL #1   Title Pt will decrease her lumbar FOTO score from 70 pt to > 80 pts in order to improve function and QOL    Time 10    Period Weeks    Status On-going      PT LONG TERM GOAL #2   Title Pt will bec ompliant with shoe lift in R shoe and demo equal alignment of pelvic girdle/  less R convex lumbar curve in order to progress to deep core/ pelvic floor exercises    Baseline L iliacc rest higher,    Time 4    Period Weeks    Status On-going      PT LONG TERM GOAL #3   Title Pt will demo no dyscoordination of pelvic floor with diaphragm and require no cues in order to improve her IAP system and pelvic organ support    Time 6    Period Weeks    Status On-going      PT LONG TERM GOAL #4   Title Pt will demo increased hip  abduction strength from 3+/5 to > 4/5 B and no genu valgus in sit to stand in order to minimize L LBP when getting out of chair    Time 8    Period Weeks    Status On-going      PT LONG TERM GOAL #5   Title Pt will demo IND with proper body mechanics with gardening, golfing, and other activities in order to minimzie worsening of prolapse    Time 10    Period Weeks    Status On-going                   Plan - 05/07/21 1019     Clinical Impression Statement Pt is progressing well with less asymmetries of her shoulders and showed more mobility of scapula with less protraction and dyskinesis of R. Pt still needed more manual Tx to release tightness at L shoulder and address a muscle pull that occurred 3 days ago at R breast/ low rib area. Still withholding shoulder exercise in hooklying due to her report of clicking in her R shoulder.  Strength tests showed weakness of posterior chain of UE on R.  RTC strength tests improved after manual Tx today. Provided tricep strengthening with eccentric control in upright position which pt required cues for proper technique. Pt also reqired cues fo upright posture to minimize extension at T/L junction and posterior COM. Pt continues to benefit from skilled PT   Personal Factors and Comorbidities Other   engages in loaded tasks ( i.e. gardening)   Examination-Activity Limitations Sit;Squat;Toileting    Stability/Clinical Decision Making Evolving/Moderate complexity    Rehab Potential Good    PT Frequency 1x / week    PT Duration Other (comment)   10   PT Treatment/Interventions Moist Heat;Stair training;Neuromuscular re-education;Functional mobility training;Therapeutic activities;Patient/family education;Therapeutic exercise;Manual techniques;Taping;Balance training;Gait training;Dry needling;Biofeedback;Splinting;Cryotherapy;Joint Manipulations;Energy conservation;Scar mobilization    Consulted and Agree with  Plan of Care Patient              Patient will benefit from skilled therapeutic intervention in order to improve the following deficits and impairments:  Decreased mobility, Decreased coordination, Decreased activity tolerance, Decreased endurance, Difficulty walking, Decreased range of motion, Decreased safety awareness, Increased muscle spasms, Hypomobility, Decreased strength, Decreased scar mobility, Postural dysfunction, Improper body mechanics, Pain, Abnormal gait, Impaired flexibility  Visit Diagnosis: Sacrococcygeal disorders, not elsewhere classified  Unequal leg length  Other lack of coordination     Problem List Patient Active Problem List   Diagnosis Date Noted   Cystocele with prolapse 02/18/2021   Decreased bone density 12/28/2020   Acute midline low back pain without sciatica 12/24/2020   Prolapse of female pelvic organs 12/24/2020   Contact dermatitis due to poison ivy 03/27/2020   Recurrent epistaxis 12/28/2019   Left anterior knee pain 09/20/2018   Encounter for general adult medical examination with abnormal findings 09/17/2017   Dyslipidemia 09/17/2017   Vitamin D deficiency 01/25/2017   Vitamin B12 deficiency 01/25/2017   Obesity, Class I, BMI 30-34.9 01/05/2017   Right shoulder pain 01/05/2017   Hypothyroidism     Jerl Mina ,PT, DPT, E-RYT  05/07/2021, 11:16 AM  [place] [address] [place], [place], [address] Phone: [phone]   Fax:  [phone]  Name: Kirsten Barnes MRN: [id number] Date of Birth: March 09, 1966

## 2021-05-13 ENCOUNTER — Other Ambulatory Visit: Payer: Self-pay

## 2021-05-13 ENCOUNTER — Ambulatory Visit: Payer: BC Managed Care – PPO | Admitting: Physical Therapy

## 2021-05-13 DIAGNOSIS — M533 Sacrococcygeal disorders, not elsewhere classified: Secondary | ICD-10-CM | POA: Diagnosis not present

## 2021-05-13 DIAGNOSIS — M2170 Unequal limb length (acquired), unspecified site: Secondary | ICD-10-CM | POA: Diagnosis not present

## 2021-05-13 DIAGNOSIS — R278 Other lack of coordination: Secondary | ICD-10-CM | POA: Diagnosis not present

## 2021-05-13 NOTE — Therapy (Signed)
[place] MAIN [facility] SERVICES [address] [place], [place], [address] Phone: [phone]   Fax:  [phone]  Physical Therapy Treatment  Patient Details  Name: Kirsten Barnes MRN: [id number] Date of Birth: 23-Sep-1966 Referring Provider (PT): Pickens MD   Encounter Date: 05/13/2021   PT End of Session - 05/13/21 0910     Visit Number 8    Number of Visits 10    Date for PT Re-Evaluation 07/22/21    PT Start Time 0905    PT Stop Time 1004    PT Time Calculation (min) 59 min    Activity Tolerance Patient tolerated treatment well             Past Medical History:  Diagnosis Date   Cardiac failure left ([place])    due to overactive thyroid   History of chickenpox    History of colon polyps    History of diverticulitis    Hypothyroidism    initial hyper    Past Surgical History:  Procedure Laterality Date   CHOLECYSTECTOMY     CHOLECYSTECTOMY, LAPAROSCOPIC  2008   COLONOSCOPY  2015   HP, TA polyps, rpt 5 yrs (Dr Julien Nordmann)   COLONOSCOPY WITH PROPOFOL N/A 10/11/2019   SSP x2, rpt 3 yrs Vicente Males)   [place]  2011   with a spur removed   [place] Bilateral 1999   stab phlebectomy    There were no vitals filed for this visit.   Subjective Assessment - 05/13/21 0910     Subjective Pt practiced swinging golf in the opposite direction to relief the rib muscle pull that came back after playing golf.  Pt also noticed her golf drive with swinging is much better with mroe weight on ballmounds instead of heels. Pt also used a stool with gardening. Pt no longer feels her prolapse during her activities except with walking and no longer with the other activities ( vaccuuming, golfing,  gardening etc)    Pertinent History Hx of 2 vaginal deliveries without tears. Gall bladder removed. Pt has a R longer leg. Hx of  plantar fasciitis L with surgery 2011    Patient Stated Goals to put the bits in place and lose weight                [facility] PT Assessment - 05/13/21 1053       Strength   Overall Strength Comments DF/EV 5/5 B, plan to assess PF MMT at next session      Palpation   Palpation comment significantly restricted mobility with toe abduction and limited DF/EV B                        Pelvic Floor Special Questions - 05/13/21 0924     Prolapse None   more cranial position of prolapse behind spubic symphysis   Pelvic Floor Internal Exam pt consented verbally with no contraindications    Exam Type Vaginal    Palpation no more tightness at anterior mm    Strength fair squeeze, definite lift   3-/5 after cues for anterior tilt              [facility] Adult PT Treatment/Exercise - 05/13/21 1054       Neuro Re-ed    Neuro Re-ed Details  cued for anterior  tilt of pelvic floor more upward motion of pelvic floor, cued for knee alignment with eccentric control of heel, gait mechanics with more mobility of 3 parts of feet      Moist Heat Therapy   Number Minutes Moist Heat 5 Minutes    Moist Heat Location --   perineum     Manual Therapy   Manual therapy comments AP/PA mob Grade III at midfoot joints to promote toe abduction and DF/EV    Internal Pelvic Floor facilitated upawrd movement of pelvic floor with coordination breathing and fascial glides                         PT Long Term Goals - 05/13/21 0911       PT LONG TERM GOAL #1   Title Pt will decrease her lumbar FOTO score from 70 pt to > 80 pts in order to improve function and QOL ( 8/8: 94 pts)    Time 10    Period Weeks    Status Achieved      PT LONG TERM GOAL #2   Title Pt will bec ompliant with shoe lift in R shoe and demo equal alignment of pelvic girdle/  less R convex lumbar curve in order to progress to deep core/ pelvic floor exercises    Baseline L iliacc rest higher,    Time 4     Period Weeks    Status Achieved      PT LONG TERM GOAL #3   Title Pt will demo no dyscoordination of pelvic floor with diaphragm and require no cues in order to improve her IAP system and pelvic organ support    Time 6    Period Weeks    Status On-going      PT LONG TERM GOAL #4   Title Pt will demo increased hip abduction strength from 3+/5 to > 4/5 B and no genu valgus in sit to stand in order to minimize L LBP when getting out of chair ( 05/13/21: B 5/5 and no valgus with sit to stand )    Time 8    Period Weeks    Status Achieved      PT LONG TERM GOAL #5   Title Pt will demo IND with proper body mechanics with gardening, golfing, and other activities in order to minimzie worsening of prolapse    Time 10    Period Weeks    Status On-going      Additional Long Term Goals   Additional Long Term Goals Yes      PT LONG TERM GOAL #6   Title Pt will report not feeling her prolapse after walking 0.5 miles in order to improve QOL    Time 8    Period Weeks    Status New    Target Date 07/08/21                   Plan - 05/13/21 1057     Clinical Impression Statement Pt has achieved 3/6 goals and progressing well towards remaining goals. Pt no longer feels her prolapse during her activities except with walking and no longer with the other activities ( vaccuuming, golfing,  gardening etc). Pt is progressing well with less pelvic floor tightness and will be ready to start with kegel exercises next session.  Pt's leg length difference and associated spinal deviations have been corrected. Pt's deep core has improved. Diastasis recti has resolved. These improvements  help her have a more efficient IAP system for pelvic function and minimizing prolapse.   Today, pt required cues for anterior tilt of pelvic floor to promote a  more upward motion of pelvic floor which will help with minimizing prolapse. Applied manual Tx to feet to promote more toe abduction and feet mobility. Pt was cued  for knee alignment with eccentric control of heel and with gait mechanics to gain more mobility of 3 parts of feet. Anticipate this regional interdependent approach will help  improve her gait which will in turn help with addressing her current c/o of prolapse while walking.  Plan to assess PF strength at next session. Pt continues to benefit from skilled PT.     Personal Factors and Comorbidities Other   engages in loaded tasks ( i.e. gardening)   Examination-Activity Limitations Sit;Squat;Toileting    Stability/Clinical Decision Making Evolving/Moderate complexity    Rehab Potential Good    PT Frequency 1x / week    PT Duration Other (comment)   10   PT Treatment/Interventions Moist Heat;Stair training;Neuromuscular re-education;Functional mobility training;Therapeutic activities;Patient/family education;Therapeutic exercise;Manual techniques;Taping;Balance training;Gait training;Dry needling;Biofeedback;Splinting;Cryotherapy;Joint Manipulations;Energy conservation;Scar mobilization    Consulted and Agree with Plan of Care Patient             Patient will benefit from skilled therapeutic intervention in order to improve the following deficits and impairments:  Decreased mobility, Decreased coordination, Decreased activity tolerance, Decreased endurance, Difficulty walking, Decreased range of motion, Decreased safety awareness, Increased muscle spasms, Hypomobility, Decreased strength, Decreased scar mobility, Postural dysfunction, Improper body mechanics, Pain, Abnormal gait, Impaired flexibility  Visit Diagnosis: No diagnosis found.     Problem List Patient Active Problem List   Diagnosis Date Noted   Cystocele with prolapse 02/18/2021   Decreased bone density 12/28/2020   Acute midline low back pain without sciatica 12/24/2020   Prolapse of female pelvic organs 12/24/2020   Contact dermatitis due to poison ivy 03/27/2020   Recurrent epistaxis 12/28/2019   Left anterior knee pain  09/20/2018   Encounter for general adult medical examination with abnormal findings 09/17/2017   Dyslipidemia 09/17/2017   Vitamin D deficiency 01/25/2017   Vitamin B12 deficiency 01/25/2017   Obesity, Class I, BMI 30-34.9 01/05/2017   Right shoulder pain 01/05/2017   Hypothyroidism     Jerl Mina ,PT, DPT, E-RYT  05/13/2021, 10:57 AM  [place] MAIN [facility] SERVICES [address] [place], [place], [address] Phone: [phone]   Fax:  [phone]  Name: Ceona Contorno MRN: [id number] Date of Birth: Sep 25, 1966

## 2021-05-13 NOTE — Patient Instructions (Addendum)
   Feet slides :   Points of contact at sitting bones  Four points of contact of foot, Heel up, ankle not twist out Lower heel Four points of contact of foot, Slide foot back   Repeated with other foot     __

## 2021-05-14 ENCOUNTER — Encounter: Payer: 59 | Admitting: Physical Therapy

## 2021-05-20 ENCOUNTER — Ambulatory Visit: Payer: BC Managed Care – PPO | Admitting: Physical Therapy

## 2021-05-20 ENCOUNTER — Other Ambulatory Visit: Payer: Self-pay

## 2021-05-20 DIAGNOSIS — M2170 Unequal limb length (acquired), unspecified site: Secondary | ICD-10-CM | POA: Diagnosis not present

## 2021-05-20 DIAGNOSIS — M533 Sacrococcygeal disorders, not elsewhere classified: Secondary | ICD-10-CM | POA: Diagnosis not present

## 2021-05-20 DIAGNOSIS — R278 Other lack of coordination: Secondary | ICD-10-CM

## 2021-05-20 NOTE — Patient Instructions (Signed)
Heel raises 10 reps x 3 x day  Hand on wall  __  PELVIC FLOOR / KEGEL EXERCISES   Pelvic floor/ Kegel exercises are used to strengthen the muscles in the base of your pelvis that are responsible for supporting your pelvic organs and preventing urine/feces leakage. Based on your therapist's recommendations, they can be performed while standing, sitting, or lying down. Imagine pelvic floor area as a diamond with pelvic landmarks: top =pubic bone, bottom tip=tailbone, sides=sitting bones (ischial tuberosities).    Make yourself aware of this muscle group by using these cues while coordinating your breath: Inhale, feel pelvic floor diamond area lower like hammock towards your feet and ribcage/belly expanding. Pause. Let the exhale naturally and feel your belly sink, abdominal muscles hugging in around you and you may notice the pelvic diamond draws upward towards your head forming a umbrella shape. Give a squeeze during the exhalation like you are stopping the flow of urine. If you are squeezing the buttock muscles, try to give 50% less effort.   Common Errors: Breath holding: If you are holding your breath, you may be bearing down against your bladder instead of pulling it up. If you belly bulges up while you are squeezing, you are holding your breath. Be sure to breathe gently in and out while exercising. Counting out loud may help you avoid holding your breath. Accessory muscle use: You should not see or feel other muscle movement when performing pelvic floor exercises. When done properly, no one can tell that you are performing the exercises. Keep the buttocks, belly and inner thighs relaxed. Overdoing it: Your muscles can fatigue and stop working for you if you over-exercise. You may actually leak more or feel soreness at the lower abdomen or rectum.  [place] y  SHORT HOLDS: Position: on back, sitting  Inhale and then exhale. Then squeeze the muscle.  (Be sure to let belly  sink in with exhales and not push outward) Perform 5 repetitions, 5  Times/day                      DECREASE DOWNWARD PRESSURE ON  YOUR PELVIC FLOOR, ABDOMINAL, LOW BACK MUSCLES       PRESERVE YOUR PELVIC HEALTH LONG-TERM   ** SQUEEZE pelvic floor BEFORE YOUR SNEEZE, COUGH, LAUGH   ** EXHALE BEFORE YOU RISE AGAINST GRAVITY (lifting, sit to stand, from squat to stand)   ** LOG ROLL OUT OF BED INSTEAD OF CRUNCH/SIT-UP

## 2021-05-20 NOTE — Therapy (Signed)
[place] MAIN [facility] SERVICES [address] [place], [place], [address] Phone: [phone]   Fax:  [phone]  Physical Therapy Treatment  Patient Details  Name: Kirsten Barnes MRN: [id number] Date of Birth: 05/06/1966 Referring Provider (PT): Ilda Basset MD   Encounter Date: 05/20/2021   PT End of Session - 05/20/21 1509     Visit Number 9    Number of Visits 10    Date for PT Re-Evaluation 07/22/21    PT Start Time 1503    PT Stop Time 1550    PT Time Calculation (min) 47 min    Activity Tolerance Patient tolerated treatment well             Past Medical History:  Diagnosis Date   Cardiac failure left ([place])    due to overactive thyroid   History of chickenpox    History of colon polyps    History of diverticulitis    Hypothyroidism    initial hyper    Past Surgical History:  Procedure Laterality Date   CHOLECYSTECTOMY     CHOLECYSTECTOMY, LAPAROSCOPIC  2008   COLONOSCOPY  2015   HP, TA polyps, rpt 5 yrs (Dr Julien Nordmann)   COLONOSCOPY WITH PROPOFOL N/A 10/11/2019   SSP x2, rpt 3 yrs Vicente Males)   [place]  2011   with a spur removed   [place] Bilateral 1999   stab phlebectomy    There were no vitals filed for this visit.   Subjective Assessment - 05/20/21 1505     Subjective The R rib area mm strain is gone. Pt took Ibprofen which helped. Pt has not felt her prolapse the past week . Pt played a good golf game and she continues to stretch in opposite direction    Pertinent History Hx of 2 vaginal deliveries without tears. Gall bladder removed. Pt has a R longer leg. Hx of plantar fasciitis L with surgery 2011    Patient Stated Goals to put the bits in place and lose weight                [facility] PT Assessment - 05/20/21 1506       Strength   Overall Strength Comments PF with single UE  support 13reps R, 16 reps L                        Pelvic Floor Special Questions - 05/20/21 1541     Prolapse None   more cranial position of prolapse behind spubic symphysis   Pelvic Floor Internal Exam pt consented verbally with no contraindications    Exam Type Vaginal    Palpation no more tightness at anterior mm, tightness at abdominal mm    Strength good squeeze, good lift, able to hold agaisnt strong resistance   after abdominal fascial releases/pelvic floor coordination              [facility] Adult PT Treatment/Exercise - 05/20/21 1542       Therapeutic Activites    Other Therapeutic Activities administered foto      Neuro Re-ed    Neuro Re-ed Details  cued for happy baby pose to stretch back and pelvic floor      Moist Heat Therapy   Number Minutes Moist Heat 5 Minutes    Moist Heat Location Other (  comment)   prone to promote anterior tilt of pelvis     Manual Therapy   Internal Pelvic Floor facilitated upward movement of pelvic floor with coordination breathing and fascial glides                         PT Long Term Goals - 05/13/21 0911       PT LONG TERM GOAL #1   Title Pt will decrease her lumbar FOTO score from 70 pt to > 80 pts in order to improve function and QOL ( 8/8: 94 pts)    Time 10    Period Weeks    Status Achieved      PT LONG TERM GOAL #2   Title Pt will bec ompliant with shoe lift in R shoe and demo equal alignment of pelvic girdle/  less R convex lumbar curve in order to progress to deep core/ pelvic floor exercises    Baseline L iliacc rest higher,    Time 4    Period Weeks    Status Achieved      PT LONG TERM GOAL #3   Title Pt will demo no dyscoordination of pelvic floor with diaphragm and require no cues in order to improve her IAP system and pelvic organ support    Time 6    Period Weeks    Status On-going      PT LONG TERM GOAL #4   Title Pt will demo increased hip abduction strength from 3+/5 to >  4/5 B and no genu valgus in sit to stand in order to minimize L LBP when getting out of chair ( 05/13/21: B 5/5 and no valgus with sit to stand )    Time 8    Period Weeks    Status Achieved      PT LONG TERM GOAL #5   Title Pt will demo IND with proper body mechanics with gardening, golfing, and other activities in order to minimzie worsening of prolapse    Time 10    Period Weeks    Status On-going      Additional Long Term Goals   Additional Long Term Goals Yes      PT LONG TERM GOAL #6   Title Pt will report not feeling her prolapse after walking 0.5 miles in order to improve QOL    Time 8    Period Weeks    Status New    Target Date 07/08/21                   Plan - 05/20/21 1556     Clinical Impression Statement Pt progressed to seated and hooklying short contractions of pelvic floor. Pt required manual Tx internally at pelvic floor and externally at low abdomen and suprapubic area to elicit a stronger pelvic floor contraction. Prolapse is absent. Pt required less cues for anterior tilt of pelvis which helps minimize prolapse. Pt did not feel her prolapse at all last week . Pt continues to benefit from skilled PT.    Personal Factors and Comorbidities Other   engages in loaded tasks ( i.e. gardening)   Examination-Activity Limitations Sit;Squat;Toileting    Stability/Clinical Decision Making Evolving/Moderate complexity    Rehab Potential Good    PT Frequency 1x / week    PT Duration Other (comment)   10   PT Treatment/Interventions Moist Heat;Stair training;Neuromuscular re-education;Functional mobility training;Therapeutic activities;Patient/family education;Therapeutic exercise;Manual techniques;Taping;Balance training;Gait training;Dry needling;Biofeedback;Splinting;Cryotherapy;Joint Manipulations;Energy conservation;Scar mobilization  Consulted and Agree with Plan of Care Patient             Patient will benefit from skilled therapeutic intervention in  order to improve the following deficits and impairments:  Decreased mobility, Decreased coordination, Decreased activity tolerance, Decreased endurance, Difficulty walking, Decreased range of motion, Decreased safety awareness, Increased muscle spasms, Hypomobility, Decreased strength, Decreased scar mobility, Postural dysfunction, Improper body mechanics, Pain, Abnormal gait, Impaired flexibility  Visit Diagnosis: Sacrococcygeal disorders, not elsewhere classified  Unequal leg length  Other lack of coordination     Problem List Patient Active Problem List   Diagnosis Date Noted   Cystocele with prolapse 02/18/2021   Decreased bone density 12/28/2020   Acute midline low back pain without sciatica 12/24/2020   Prolapse of female pelvic organs 12/24/2020   Contact dermatitis due to poison ivy 03/27/2020   Recurrent epistaxis 12/28/2019   Left anterior knee pain 09/20/2018   Encounter for general adult medical examination with abnormal findings 09/17/2017   Dyslipidemia 09/17/2017   Vitamin D deficiency 01/25/2017   Vitamin B12 deficiency 01/25/2017   Obesity, Class I, BMI 30-34.9 01/05/2017   Right shoulder pain 01/05/2017   Hypothyroidism     Jerl Mina ,PT, DPT, E-RYT  05/20/2021, 4:00 PM  [place] MAIN [facility] SERVICES [address] [place], [place], [address] Phone: [phone]   Fax:  [phone]  Name: Kirsten Barnes MRN: [id number] Date of Birth: [birth year]

## 2021-05-21 ENCOUNTER — Encounter: Payer: 59 | Admitting: Physical Therapy

## 2021-05-28 ENCOUNTER — Encounter: Payer: 59 | Admitting: Physical Therapy

## 2021-05-31 ENCOUNTER — Encounter: Payer: 59 | Admitting: Physical Therapy

## 2021-06-04 ENCOUNTER — Encounter: Payer: 59 | Admitting: Physical Therapy

## 2021-06-11 ENCOUNTER — Ambulatory Visit: Payer: BC Managed Care – PPO | Admitting: Physical Therapy

## 2021-06-18 ENCOUNTER — Ambulatory Visit: Payer: BC Managed Care – PPO | Attending: Obstetrics and Gynecology | Admitting: Physical Therapy

## 2021-06-18 ENCOUNTER — Other Ambulatory Visit: Payer: Self-pay

## 2021-06-18 DIAGNOSIS — M533 Sacrococcygeal disorders, not elsewhere classified: Secondary | ICD-10-CM | POA: Insufficient documentation

## 2021-06-18 DIAGNOSIS — R278 Other lack of coordination: Secondary | ICD-10-CM | POA: Insufficient documentation

## 2021-06-18 DIAGNOSIS — M2170 Unequal limb length (acquired), unspecified site: Secondary | ICD-10-CM | POA: Diagnosis not present

## 2021-06-18 NOTE — Patient Instructions (Signed)
ANTERIOR TILT THE PELVIS BEFORE, deep core and pelvic exercises DO MORE L PELVIC FLOOR STRETCHES AFTER GOLFING    PELVIC FLOOR / KEGEL EXERCISES   Pelvic floor/ Kegel exercises are used to strengthen the muscles in the base of your pelvis that are responsible for supporting your pelvic organs and preventing urine/feces leakage. Based on your therapist's recommendations, they can be performed while standing, sitting, or lying down. Imagine pelvic floor area as a diamond with pelvic landmarks: top =pubic bone, bottom tip=tailbone, sides=sitting bones (ischial tuberosities).    Make yourself aware of this muscle group by using these cues while coordinating your breath: Inhale, feel pelvic floor diamond area lower like hammock towards your feet and ribcage/belly expanding. Pause. Let the exhale naturally and feel your belly sink, abdominal muscles hugging in around you and you may notice the pelvic diamond draws upward towards your head forming a umbrella shape. Give a squeeze during the exhalation like you are stopping the flow of urine. If you are squeezing the buttock muscles, try to give 50% less effort.   Common Errors: Breath holding: If you are holding your breath, you may be bearing down against your bladder instead of pulling it up. If you belly bulges up while you are squeezing, you are holding your breath. Be sure to breathe gently in and out while exercising. Counting out loud may help you avoid holding your breath. Accessory muscle use: You should not see or feel other muscle movement when performing pelvic floor exercises. When done properly, no one can tell that you are performing the exercises. Keep the buttocks, belly and inner thighs relaxed. Overdoing it: Your muscles can fatigue and stop working for you if you over-exercise. You may actually leak more or feel soreness at the lower abdomen or rectum.  YOUR HOME EXERCISE PROGRAM  LONG HOLDS:   Position: on back or reclined in car  seat ( do not lift head to sit up, instead make sure to use the handle to raise the car seat up and keep spine/head relaxed to not place load on pelvic floor/ abdominal muscle)    Inhale and then exhale. Then squeeze the muscle and count aloud for 3 seconds. Rest with three long breaths. (Be sure to let belly sink in with exhales and not push outward) Perform 5 repetitions, 3 different times/day  SHORT HOLDS: Position: on  sitting  Inhale and then exhale. Then squeeze the muscle.  (Be sure to let belly sink in with exhales and not push outward) Perform 5 repetitions, 5 different  Times/day                      DECREASE DOWNWARD PRESSURE ON  YOUR PELVIC FLOOR, ABDOMINAL, LOW BACK MUSCLES       PRESERVE YOUR PELVIC HEALTH LONG-TERM   ** SQUEEZE pelvic floor BEFORE YOUR SNEEZE, COUGH, LAUGH   ** EXHALE BEFORE YOU RISE AGAINST GRAVITY (lifting, sit to stand, from squat to stand)   ** LOG ROLL OUT OF BED INSTEAD OF CRUNCH/SIT-UP

## 2021-06-19 NOTE — Therapy (Signed)
[place] MAIN [facility] SERVICES [address] [place], [place], [address] Phone: [phone]   Fax:  [phone]  Physical Therapy Treatment / Progress note reporting from 03/26/21 to 06/19/21  Patient Details  Name: Kirsten Barnes MRN: [id number] Date of Birth: 11-26-1965 Referring Provider (PT): Ilda Basset MD   Encounter Date: 06/18/2021   PT End of Session - 06/18/21 1711     Visit Number 10    Date for PT Re-Evaluation 07/22/21    PT Start Time 1706    PT Stop Time 1800    PT Time Calculation (min) 54 min    Activity Tolerance Patient tolerated treatment well             Past Medical History:  Diagnosis Date   Cardiac failure left ([place])    due to overactive thyroid   History of chickenpox    History of colon polyps    History of diverticulitis    Hypothyroidism    initial hyper    Past Surgical History:  Procedure Laterality Date   CHOLECYSTECTOMY     CHOLECYSTECTOMY, LAPAROSCOPIC  2008   COLONOSCOPY  2015   HP, TA polyps, rpt 5 yrs (Dr Julien Nordmann)   COLONOSCOPY WITH PROPOFOL N/A 10/11/2019   SSP x2, rpt 3 yrs Vicente Males)   [place]  2011   with a spur removed   [place] Bilateral 1999   stab phlebectomy    There were no vitals filed for this visit.   Subjective Assessment - 06/18/21 1710     Subjective Pt felt 3 days when she felt prolapse    Pertinent History Hx of 2 vaginal deliveries without tears. Gall bladder removed. Pt has a R longer leg. Hx of plantar fasciitis L with surgery 2011    Patient Stated Goals to put the bits in place and lose weight                            Pelvic Floor Special Questions - 06/19/21 1655     Prolapse None   more cranial position of prolapse behind spubic symphysis   Pelvic Floor Internal Exam pt consented verbally with no contraindications     Exam Type Vaginal    Palpation tightness at L anterior mm    Strength good squeeze, good lift, able to hold agaisnt strong resistance   5 quick reps. 1 sec seated.  supine 3 sec. 3 reps              [facility] Adult PT Treatment/Exercise - 06/19/21 1654       Therapeutic Activites    Other Therapeutic Activities administered foto, reassessed goals      Neuro Re-ed    Neuro Re-ed Details  cued for L pelvic floor stretches to minimize golfing mm asymmetries      Moist Heat Therapy   Moist Heat Location Other (comment)   prone to promote anterior tilt of pelvis     Manual Therapy   Internal Pelvic Floor STM/MWM to promote L pelvic floor lengthening and kegels                          PT Long Term Goals - 06/18/21 1712       PT LONG TERM GOAL #1  Title Pt will decrease her lumbar FOTO score from 70 pt to > 80 pts in order to improve function and QOL ( 8/8: 94 pts)    Time 10    Period Weeks    Status Achieved      PT LONG TERM GOAL #2   Title Pt will bec ompliant with shoe lift in R shoe and demo equal alignment of pelvic girdle/  less R convex lumbar curve in order to progress to deep core/ pelvic floor exercises    Baseline L iliacc rest higher,    Time 4    Period Weeks    Status Achieved      PT LONG TERM GOAL #3   Title Pt will demo no dyscoordination of pelvic floor with diaphragm and require no cues in order to improve her IAP system and pelvic organ support    Time 6    Period Weeks    Status Achieved      PT LONG TERM GOAL #4   Title Pt will demo increased hip abduction strength from 3+/5 to > 4/5 B and no genu valgus in sit to stand in order to minimize L LBP when getting out of chair ( 05/13/21: B 5/5 and no valgus with sit to stand )    Time 8    Period Weeks    Status Achieved      PT LONG TERM GOAL #5   Title Pt will demo IND with proper body mechanics with gardening, golfing, and other activities in order to minimzie worsening of  prolapse    Time 10    Period Weeks    Status Achieved      Additional Long Term Goals   Additional Long Term Goals Yes      PT LONG TERM GOAL #6   Title Pt will report not feeling her prolapse after walking 0.5 miles in order to improve QOL    Time 8    Period Weeks    Status Achieved      PT LONG TERM GOAL #7   Title Pt will demo 1 sec quick contractions 5 reps in seated position, 3 sec  contractions x 3 reps in supine position in order to further minimize prolapse    Time 4    Period Weeks    Status New    Target Date 07/16/21                   Plan - 06/18/21 1711     Clinical Impression Statement Pt has achieved 6/7 goals and is progressing well towards remaining goal. Pt 's urinary Sx have improved. Pt demo improved deep core strength and is progressing towards kegel strengthening as pt demo decreasing pelvic floor mm tightness and improved lengthening of mm.  Pt 's asymmetries of shoulder and pelvic girdle height have been resolved with manual Tx and HEP. Hobbies likely related to asymmetries. Complimentary stretches have been added to HEP.  L pelvic floor mm tightness is likely related to golfing.   Pt continues to benefit from  skilled PT to advance to long endurance of kegel program.    Personal Factors and Comorbidities Other   engages in loaded tasks ( i.e. gardening)   Examination-Activity Limitations Sit;Squat;Toileting    Stability/Clinical Decision Making Evolving/Moderate complexity    Rehab Potential Good    PT Frequency 1x / week    PT Duration Other (comment)   10   PT Treatment/Interventions Moist Heat;Stair training;Neuromuscular re-education;Functional  mobility training;Therapeutic activities;Patient/family education;Therapeutic exercise;Manual techniques;Taping;Balance training;Gait training;Dry needling;Biofeedback;Splinting;Cryotherapy;Joint Manipulations;Energy conservation;Scar mobilization    Consulted and Agree with Plan of Care Patient              Patient will benefit from skilled therapeutic intervention in order to improve the following deficits and impairments:  Decreased mobility, Decreased coordination, Decreased activity tolerance, Decreased endurance, Difficulty walking, Decreased range of motion, Decreased safety awareness, Increased muscle spasms, Hypomobility, Decreased strength, Decreased scar mobility, Postural dysfunction, Improper body mechanics, Pain, Abnormal gait, Impaired flexibility  Visit Diagnosis: Sacrococcygeal disorders, not elsewhere classified  Unequal leg length  Other lack of coordination     Problem List Patient Active Problem List   Diagnosis Date Noted   Cystocele with prolapse 02/18/2021   Decreased bone density 12/28/2020   Acute midline low back pain without sciatica 12/24/2020   Prolapse of female pelvic organs 12/24/2020   Contact dermatitis due to poison ivy 03/27/2020   Recurrent epistaxis 12/28/2019   Left anterior knee pain 09/20/2018   Encounter for general adult medical examination with abnormal findings 09/17/2017   Dyslipidemia 09/17/2017   Vitamin D deficiency 01/25/2017   Vitamin B12 deficiency 01/25/2017   Obesity, Class I, BMI 30-34.9 01/05/2017   Right shoulder pain 01/05/2017   Hypothyroidism     Jerl Mina, PT 06/19/2021, 5:35 PM  [place] MAIN [facility] SERVICES [address] [place], [place], [address] Phone: [phone]   Fax:  [phone]  Name: Aster Dauer MRN: [id number] Date of Birth: Mar 24, 1966

## 2021-06-25 ENCOUNTER — Ambulatory Visit: Payer: BC Managed Care – PPO | Admitting: Physical Therapy

## 2021-06-26 ENCOUNTER — Encounter: Payer: Self-pay | Admitting: Family Medicine

## 2021-06-26 NOTE — Telephone Encounter (Signed)
Updated pt's chart.  

## 2021-07-02 ENCOUNTER — Ambulatory Visit: Payer: BC Managed Care – PPO | Admitting: Physical Therapy

## 2021-07-02 ENCOUNTER — Other Ambulatory Visit: Payer: Self-pay

## 2021-07-02 DIAGNOSIS — M533 Sacrococcygeal disorders, not elsewhere classified: Secondary | ICD-10-CM

## 2021-07-02 DIAGNOSIS — R278 Other lack of coordination: Secondary | ICD-10-CM

## 2021-07-02 DIAGNOSIS — M2170 Unequal limb length (acquired), unspecified site: Secondary | ICD-10-CM | POA: Diagnosis not present

## 2021-07-02 NOTE — Therapy (Signed)
[place] MAIN [facility] SERVICES [address] [place], [place], [address] Phone: [phone]   Fax:  [phone]  Physical Therapy Treatment / Discharge Summary from 03/26/21 to 07/02/21 across 11 visits   Patient Details  Name: Kirsten Barnes MRN: [id number] Date of Birth: December 03, 1965 Referring Provider (PT): Ilda Basset MD   Encounter Date: 07/02/2021   PT End of Session - 07/02/21 1711     Visit Number 11    Date for PT Re-Evaluation 07/22/21    PT Start Time 1707    PT Stop Time 1735    PT Time Calculation (min) 28 min    Activity Tolerance Patient tolerated treatment well             Past Medical History:  Diagnosis Date   Cardiac failure left ([place])    due to overactive thyroid   History of chickenpox    History of colon polyps    History of diverticulitis    Hypothyroidism    initial hyper    Past Surgical History:  Procedure Laterality Date   CHOLECYSTECTOMY     CHOLECYSTECTOMY, LAPAROSCOPIC  2008   COLONOSCOPY  2015   HP, TA polyps, rpt 5 yrs (Dr Julien Nordmann)   COLONOSCOPY WITH PROPOFOL N/A 10/11/2019   SSP x2, rpt 3 yrs Vicente Males)   [place]  2011   with a spur removed   [place] Bilateral 1999   stab phlebectomy    There were no vitals filed for this visit.   Subjective Assessment - 07/02/21 1709     Subjective Pt played golf for the first 9 holes and feels her prolapse.  Pt has her husband picks up the balls and uses device to pick up the balls to avoid bending down. Since using these modifications, pt has not felt her prolapse as much    Pertinent History Hx of 2 vaginal deliveries without tears. Gall bladder removed. Pt has a R longer leg. Hx of plantar fasciitis L with surgery 2011    Patient Stated Goals to put the bits in place and lose weight                             Pelvic Floor Special Questions - 07/02/21 1717     Prolapse None;Anterior Wall   more cranial position of prolapse behind pubic symphysis after Tx   Pelvic Floor Internal Exam pt consented verbally with no contraindications    Exam Type Vaginal    Strength good squeeze, good lift, able to hold agaisnt strong resistance   5 quick reps. 1 sec seated.  supine 3 sec. 3 reps   Strength # of reps 5    Strength # of seconds 3               [facility] Adult PT Treatment/Exercise - 07/02/21 1730       Therapeutic Activites    Other Therapeutic Activities discussed d/c and continuation of pelvic floor contraction      Neuro Re-ed    Neuro Re-ed Details  cued for long endurance contractions      Manual Therapy   Internal Pelvic Floor STM/MWM at anterior mm at pubic symphysis to promote upward lift of prolapse  PT Long Term Goals - 07/02/21 1731       PT LONG TERM GOAL #1   Title Pt will decrease her lumbar FOTO score from 70 pt to > 80 pts in order to improve function and QOL ( 8/8: 94 pts)    Time 10    Period Weeks    Status Achieved      PT LONG TERM GOAL #2   Title Pt will bec ompliant with shoe lift in R shoe and demo equal alignment of pelvic girdle/  less R convex lumbar curve in order to progress to deep core/ pelvic floor exercises    Baseline L iliacc rest higher,    Time 4    Period Weeks    Status Achieved      PT LONG TERM GOAL #3   Title Pt will demo no dyscoordination of pelvic floor with diaphragm and require no cues in order to improve her IAP system and pelvic organ support    Time 6    Period Weeks    Status Achieved      PT LONG TERM GOAL #4   Title Pt will demo increased hip abduction strength from 3+/5 to > 4/5 B and no genu valgus in sit to stand in order to minimize L LBP when getting out of chair ( 05/13/21: B 5/5 and no valgus with sit to stand )    Time 8    Period Weeks    Status Achieved      PT LONG TERM GOAL  #5   Title Pt will demo IND with proper body mechanics with gardening, golfing, and other activities in order to minimzie worsening of prolapse    Time 10    Period Weeks    Status Achieved      PT LONG TERM GOAL #6   Title Pt will report not feeling her prolapse after walking 0.5 miles in order to improve QOL    Time 8    Period Weeks    Status Achieved      PT LONG TERM GOAL #7   Title Pt will demo 1 sec quick contractions 5 reps in seated position, 3 sec  contractions x 3 reps in supine position in order to further minimize prolapse    Time 4    Period Weeks    Status Achieved                   Plan - 07/02/21 1821     Clinical Impression Statement Pt has achieved 100% of goals. Pt 's shift leg difference has been adjusted with shoe lift and manual Tx helped to realign her spinal deviations 2/2 to leg length difference/ golfing as a hobby. Pt 's LBP pain resolved. Pt is able to participate in walking, golfing, and swimming and prolapse does not interfere with these activities. Pt showed no more tightness of pelvic floor mm and more coordination and strength of pelvic floor mm. Pt is IND with kegel strengthening. Pt is ready for d/c,    Personal Factors and Comorbidities Other   engages in loaded tasks ( i.e. gardening)   Examination-Activity Limitations Sit;Squat;Toileting    Stability/Clinical Decision Making Evolving/Moderate complexity    Rehab Potential Good    PT Frequency 1x / week    PT Duration Other (comment)   10   PT Treatment/Interventions Moist Heat;Stair training;Neuromuscular re-education;Functional mobility training;Therapeutic activities;Patient/family education;Therapeutic exercise;Manual techniques;Taping;Balance training;Gait training;Dry needling;Biofeedback;Splinting;Cryotherapy;Joint Manipulations;Energy conservation;Scar mobilization    Consulted and  Agree with Plan of Care Patient             Patient will benefit from skilled therapeutic  intervention in order to improve the following deficits and impairments:  Decreased mobility, Decreased coordination, Decreased activity tolerance, Decreased endurance, Difficulty walking, Decreased range of motion, Decreased safety awareness, Increased muscle spasms, Hypomobility, Decreased strength, Decreased scar mobility, Postural dysfunction, Improper body mechanics, Pain, Abnormal gait, Impaired flexibility  Visit Diagnosis: Unequal leg length  Sacrococcygeal disorders, not elsewhere classified  Other lack of coordination     Problem List Patient Active Problem List   Diagnosis Date Noted   Cystocele with prolapse 02/18/2021   Decreased bone density 12/28/2020   Acute midline low back pain without sciatica 12/24/2020   Prolapse of female pelvic organs 12/24/2020   Contact dermatitis due to poison ivy 03/27/2020   Recurrent epistaxis 12/28/2019   Left anterior knee pain 09/20/2018   Encounter for general adult medical examination with abnormal findings 09/17/2017   Dyslipidemia 09/17/2017   Vitamin D deficiency 01/25/2017   Vitamin B12 deficiency 01/25/2017   Obesity, Class I, BMI 30-34.9 01/05/2017   Right shoulder pain 01/05/2017   Hypothyroidism     Jerl Mina, PT 07/02/2021, 6:23 PM  [place] MAIN [facility] SERVICES [address] [place], [place], [address] Phone: [phone]   Fax:  [phone]  Name: Kirsten Barnes MRN: [id number] Date of Birth: Aug 10, 1966

## 2021-07-06 ENCOUNTER — Encounter: Payer: Self-pay | Admitting: Family Medicine

## 2021-07-06 MED ORDER — LEVOTHYROXINE SODIUM 112 MCG PO TABS: 112.0000 ug | ORAL_TABLET | Freq: Every day | ORAL | 1 refills | Status: DC

## 2021-07-19 ENCOUNTER — Encounter: Payer: Self-pay | Admitting: Radiology

## 2021-07-23 ENCOUNTER — Other Ambulatory Visit: Payer: Self-pay | Admitting: Family Medicine

## 2021-07-23 DIAGNOSIS — Z1231 Encounter for screening mammogram for malignant neoplasm of breast: Secondary | ICD-10-CM

## 2021-07-25 ENCOUNTER — Encounter: Payer: Self-pay | Admitting: Physical Therapy

## 2021-08-01 ENCOUNTER — Other Ambulatory Visit: Payer: Self-pay | Admitting: Family Medicine

## 2021-08-06 ENCOUNTER — Ambulatory Visit: Payer: BC Managed Care – PPO | Admitting: Obstetrics and Gynecology

## 2021-08-06 ENCOUNTER — Other Ambulatory Visit: Payer: Self-pay

## 2021-08-06 ENCOUNTER — Encounter: Payer: Self-pay | Admitting: Obstetrics and Gynecology

## 2021-08-06 VITALS — BP 146/87 | HR 64 | Ht 68.0 in | Wt 227.6 lb

## 2021-08-06 DIAGNOSIS — N814 Uterovaginal prolapse, unspecified: Secondary | ICD-10-CM

## 2021-08-06 NOTE — Progress Notes (Signed)
Obstetrics and Gynecology Visit Return Patient Evaluation  Appointment Date: 08/06/2021  Primary Care Provider: [place], [place] [facility]  Chief Complaint: follow up cystocele, bulge s/s  History of Present Illness:  Kirsten Barnes is a 55 y.o. G2P2 who saw me last in mid may 2022 and anterior cystocele diagnosed. Options d/w and she elected to proceed 1st with pelvic floor PT which she completed from June to late Sept and was doing well and discharged.    She states that she feels the PT definitely helped with her bulge s/s but she can still feel it. She is still very active particularly with golf and biking. No constipation. She continues to do daily at home pelvic floor exercises  Review of Systems: as noted in the History of Present Illness.  Patient Active Problem List   Diagnosis Date Noted   Cystocele with prolapse 02/18/2021   Decreased bone density 12/28/2020   Acute midline low back pain without sciatica 12/24/2020   Prolapse of female pelvic organs 12/24/2020   Contact dermatitis due to poison ivy 03/27/2020   Recurrent epistaxis 12/28/2019   Left anterior knee pain 09/20/2018   Encounter for general adult medical examination with abnormal findings 09/17/2017   Dyslipidemia 09/17/2017   Vitamin D deficiency 01/25/2017   Vitamin B12 deficiency 01/25/2017   Obesity, Class I, BMI 30-34.9 01/05/2017   Right shoulder pain 01/05/2017   Hypothyroidism    Medications:  Jaeleigh Monaco had no medications administered during this visit. Current Outpatient Medications  Medication Sig Dispense Refill   cetirizine (ZYRTEC) 10 MG tablet Take 10 mg by mouth daily.     Cholecalciferol (VITAMIN D) 50 MCG (2000 UT) CAPS Take 1 capsule (2,000 Units total) by mouth daily. 30 capsule    ibuprofen (ADVIL,MOTRIN) 200 MG tablet Take 400 mg by mouth daily as needed.     levothyroxine (SYNTHROID) 112 MCG tablet Take 1 tablet (112 mcg total) by  mouth daily. 90 tablet 1   Tetrahydrozoline HCl (EYE DROPS OP) Apply 1 drop to eye daily as needed.     vitamin B-12 (CYANOCOBALAMIN) 1000 MCG tablet Take 1 tablet (1,000 mcg total) by mouth daily.     methocarbamol (ROBAXIN) 500 MG tablet Take 1 tablet (500 mg total) by mouth 3 (three) times daily as needed for muscle spasms (sedation precautions). 30 tablet 0   No current facility-administered medications for this visit.    Allergies: is allergic to other.  Physical Exam:  BP (!) 146/87   Pulse 64   Ht 5\' 8"  (1.727 m)   Wt 227 lb 9.6 oz (103.2 kg)   BMI 34.61 kg/m  Body mass index is 34.61 kg/m. General appearance: Well nourished, well developed female in no acute distress.  Abdomen: soft, nttp , nd Neuro/Psych:  Normal mood and affect.    Pelvic exam:  EGBUS: normal, mild atrophy Vaginal vault: normal Cervix:  normal Bimanual: negative With valsalva prolapse seems better with Aa and Ba last time to -1 and now Aa to -2 and Ba to -2 Cervix still with some prolapse to about -4 on valsalva. No e/o rectocele. Contraction strength moderate   Assessment: pt improved but still symptomatic  Plan:  1. Cystocele with prolapse Pt amenable to urogyn referral to go over different options - Ambulatory referral to Urogynecology   RTC: PRN  Durene Romans MD Attending Center for [facility])

## 2021-08-07 ENCOUNTER — Encounter: Payer: Self-pay | Admitting: Obstetrics and Gynecology

## 2021-08-07 ENCOUNTER — Ambulatory Visit: Payer: BC Managed Care – PPO | Admitting: Obstetrics and Gynecology

## 2021-08-07 VITALS — BP 144/83 | HR 76 | Ht 68.0 in | Wt 227.0 lb

## 2021-08-07 DIAGNOSIS — N8110 Cystocele, unspecified: Secondary | ICD-10-CM | POA: Diagnosis not present

## 2021-08-07 DIAGNOSIS — R350 Frequency of micturition: Secondary | ICD-10-CM

## 2021-08-07 LAB — POCT URINALYSIS DIPSTICK
Appearance: NORMAL
Bilirubin, UA: NEGATIVE
Blood, UA: NEGATIVE
Glucose, UA: NEGATIVE
Ketones, UA: NEGATIVE
Leukocytes, UA: NEGATIVE
Nitrite, UA: NEGATIVE
Protein, UA: NEGATIVE
Spec Grav, UA: 1.015 (ref 1.010–1.025)
Urobilinogen, UA: 0.2 E.U./dL
pH, UA: 7 (ref 5.0–8.0)

## 2021-08-07 NOTE — Progress Notes (Signed)
[facility] New Patient Evaluation and Consultation  Referring Provider: Aletha Halim, MD PCP: Ria Bush, MD Date of Service: 08/07/2021  SUBJECTIVE Chief Complaint: New Patient (Initial Visit) - prolapse  History of Present Illness: Kirsten Barnes is a 55 y.o. White or Caucasian female seen in consultation at the request of Dr. Ilda Basset for evaluation of prolapse.    Review of records significant for: Has a cystocele and has been attending physical therapy. Still feels the bulge when she is active.   Urinary Symptoms: Do you leak urine? No  How many times do you urinate in the daytime? 6 times but depends on fluid intake  How many times do you wake up at night to urinate? Almost never.  When you urinate, does it feel like you empty your bladder completely? Yes  Do you use a catheter to help empty your bladder? No   does not use a catheter to empty bladder.  When urinating, she feels she has no difficulties  UTIs:  0  UTI's in the last year.   Denies history of blood in urine and kidney or bladder stones  Pelvic Organ Prolapse Symptoms:                  Do you feel a bulge in the vagina? Yes  How long has a bulge been present? 8 months  Do you see a bulge coming out of the vagina? No  Is it bothersome? Yes  Feels like a bubble in the vagina. 1  Bowel Symptom: Bowel movements: 1 time(s) per day What is the consistency of your stools? soft  Do you strain to empty your bowels? No  Do you have to push on your rectum or vagina to be able to empty your bowels? No  Do you have difficulty completely emptying your rectum during a bowel movement? No  Do you ever leak bowel contents? No   Bowel regimen: none Last colonoscopy: Date Jan 2021, Results- polyps present  Sexual Function Sexually active: yes.  Sexual orientation:  heterosexual Pain with sex: No  Pelvic Pain Denies pelvic pain   Past Medical History:  Past Medical History:  Diagnosis Date    History of chickenpox    History of colon polyps    History of diverticulitis    Hypothyroidism    initial hyper     Past Surgical History:   Past Surgical History:  Procedure Laterality Date   CHOLECYSTECTOMY     CHOLECYSTECTOMY, LAPAROSCOPIC  2008   COLONOSCOPY  2015   HP, TA polyps, rpt 5 yrs (Dr Julien Nordmann)   COLONOSCOPY WITH PROPOFOL N/A 10/11/2019   SSP x2, rpt 3 yrs Vicente Males)   [place]  2011   with a spur removed   TONSILLECTOMY     WITH ADENOIDECTOMY   TONSILLECTOMY AND ADENOIDECTOMY  1976   VARICOSE VEIN SURGERY Bilateral 1999   stab phlebectomy     Past OB/GYN History: OB History  Gravida Para Term Preterm AB Living  2 2 2     2   SAB IAB Ectopic Multiple Live Births          2    # Outcome Date GA Lbr Len/2nd Weight Sex Delivery Anes PTL Lv  2 Term 04/09/95    F Vag-Spont EPI  LIV  1 Term 04/20/93    M Vag-Spont EPI  LIV   Menopausal: Yes, Denies vaginal bleeding since menopause Last pap smear was 09/2019- neg.  Any history of abnormal pap smears: no.   Medications: She has a current medication list which includes the following prescription(s): cetirizine, vitamin d, ibuprofen, levothyroxine, carboxymethylcellulose sodium, and vitamin b-12.   Allergies: Patient is allergic to other.   Social History:  Social History   Tobacco Use   Smoking status: Never   Smokeless tobacco: Never  Vaping Use   Vaping Use: Never used  Substance Use Topics   Alcohol use: Yes    Comment: glass of wine with dinner (daily)   Drug use: No    Relationship status: married She lives with husband.   She is not employed. Regular exercise: Yes: cycling and golf History of abuse: No  Family History:   Family History  Problem Relation Age of Onset   Hypothyroidism Mother    Cancer Father 66       stomach   Stroke Paternal Grandfather    Stroke Maternal Grandmother    Diabetes Neg Hx    CAD Neg Hx    Breast cancer Neg Hx       Review of Systems: Review of Systems  Constitutional:  Negative for fever, malaise/fatigue and weight loss.  Respiratory:  Negative for cough, shortness of breath and wheezing.   Cardiovascular:  Negative for chest pain, palpitations and leg swelling.  Gastrointestinal:  Negative for abdominal pain and blood in stool.  Genitourinary:  Negative for dysuria.  Musculoskeletal:  Negative for myalgias.  Skin:  Negative for rash.  Neurological:  Negative for dizziness and headaches.  Endo/Heme/Allergies:  Does not bruise/bleed easily.  Psychiatric/Behavioral:  Negative for depression. The patient is not nervous/anxious.     OBJECTIVE Physical Exam: Vitals:   08/07/21 1528  BP: (!) 144/83  Pulse: 76  Weight: 227 lb (103 kg)  Height: 5\' 8"  (1.727 m)    Physical Exam Constitutional:      General: She is not in acute distress. Pulmonary:     Effort: Pulmonary effort is normal.  Abdominal:     General: There is no distension.     Palpations: Abdomen is soft.     Tenderness: There is no abdominal tenderness. There is no rebound.  Musculoskeletal:        General: No swelling. Normal range of motion.  Skin:    General: Skin is warm and dry.     Findings: No rash.  Neurological:     Mental Status: She is alert and oriented to person, place, and time.  Psychiatric:        Mood and Affect: Mood normal.        Behavior: Behavior normal.     GU / Detailed Urogynecologic Evaluation:  Pelvic Exam: Normal external female genitalia; Bartholin's and Skene's glands normal in appearance; urethral meatus normal in appearance, no urethral masses or discharge.   CST: negative  Speculum exam reveals normal vaginal mucosa with atrophy. Cervix normal appearance. Uterus normal single, nontender. Adnexa no mass, fullness, tenderness.     Pelvic floor strength II/V  Pelvic floor musculature: Right levator non-tender, Right obturator non-tender, Left levator non-tender, Left obturator  non-tender  POP-Q:   POP-Q  -0.5                                            Aa   -0.5  Ba  -7                                              C   4                                            Gh  3.5                                            Pb  9                                            tvl   -2.5                                            Ap  -2.5                                            Bp  -7                                              D     Rectal Exam:  Normal external rectum  Post-Void Residual (PVR) by Bladder Scan: In order to evaluate bladder emptying, we discussed obtaining a postvoid residual and she agreed to this procedure.  Procedure: The ultrasound unit was placed on the patient's abdomen in the suprapubic region after the patient had voided. A PVR of 18 ml was obtained by bladder scan.  Laboratory Results: POC urine:  negative  ASSESSMENT AND PLAN Ms. Delk is a 55 y.o. with:  1. Prolapse of anterior vaginal wall   2. Urinary frequency    Stage II anterior, Stage I posterior, Stage I apical prolapse - For treatment of pelvic organ prolapse, we discussed options for management including expectant management, conservative management, and surgical management, such as Kegels, a pessary, pelvic floor physical therapy, and specific surgical procedures. - She would like to try a pessary, will return for a fitting   Jaquita Folds, MD   Medical Decision Making:  - Reviewed/ ordered a clinical laboratory test - Review and summation of prior records

## 2021-09-02 ENCOUNTER — Ambulatory Visit
Admission: RE | Admit: 2021-09-02 | Discharge: 2021-09-02 | Disposition: A | Discharge status: Home / Self Care | Payer: BC Managed Care – PPO | Source: Ambulatory Visit | Attending: Family Medicine | Admitting: Family Medicine

## 2021-09-02 ENCOUNTER — Other Ambulatory Visit: Payer: Self-pay

## 2021-09-02 DIAGNOSIS — Z1231 Encounter for screening mammogram for malignant neoplasm of breast: Secondary | ICD-10-CM | POA: Insufficient documentation

## 2021-09-06 ENCOUNTER — Encounter: Payer: Self-pay | Admitting: Obstetrics and Gynecology

## 2021-09-06 ENCOUNTER — Other Ambulatory Visit: Payer: Self-pay

## 2021-09-06 ENCOUNTER — Ambulatory Visit (INDEPENDENT_AMBULATORY_CARE_PROVIDER_SITE_OTHER): Payer: BC Managed Care – PPO | Admitting: Obstetrics and Gynecology

## 2021-09-06 VITALS — BP 159/102 | HR 65

## 2021-09-06 DIAGNOSIS — N8110 Cystocele, unspecified: Secondary | ICD-10-CM | POA: Diagnosis not present

## 2021-09-06 NOTE — Progress Notes (Addendum)
Urogynecology   Subjective:     Chief Complaint: Pessary fitting  History of Present Illness: Kirsten Barnes is a 55 y.o. female with stage II pelvic organ prolapse who presents today for a pessary fitting.    Past Medical History: Patient  has a past medical history of History of chickenpox, History of colon polyps, History of diverticulitis, and Hypothyroidism.   Past Surgical History: She  has a past surgical history that includes Cholecystectomy, laparoscopic (2008); Tonsillectomy and adenoidectomy (1976); Plantar fascia surgery (2011); Varicose vein surgery (Bilateral, 1999); Colonoscopy (2015); Cholecystectomy; Diagnostic laparoscopy; Tonsillectomy; and Colonoscopy with propofol (N/A, 10/11/2019).   Medications: She has a current medication list which includes the following prescription(s): cetirizine, vitamin d, ibuprofen, levothyroxine, carboxymethylcellulose sodium, and vitamin b-12.   Allergies: Patient is allergic to other.   Social History: Patient  reports that she has never smoked. She has never used smokeless tobacco. She reports current alcohol use. She reports that she does not use drugs.      Objective:    BP (!) 159/102   Pulse 65  Gen: No apparent distress, A&O x 3. Pelvic Exam: Normal external female genitalia; Bartholin's and Skene's glands normal in appearance; urethral meatus normal in appearance, no urethral masses or discharge.   A size #2 ring with support pessary was fitted. It was comfortable, stayed in place with valsalva and was an appropriate size on examination, with one finger fitting between the pessary and the vaginal walls. She demonstrated proper removal and replacement. Lot # [id number], Exp 03/15/26   POP-Q (08/07/21):    POP-Q   -0.5                                            Aa   -0.5                                           Ba   -7                                              C    4                                             Gh   3.5                                            Pb   9                                            tvl    -2.5  Ap   -2.5                                            Bp   -7                                              D        Assessment/Plan:    Assessment: [name] is a 55 y.o. with stage II pelvic organ prolapse who presents for a pessary fitting. Plan: She was fitted with a #2 ring with support pessary. She will remove at least weekly or before intercourse .   Follow-up in 2-3 weeks for a pessary check or sooner as needed.  All questions were answered.    Jaquita Folds, MD

## 2021-09-21 ENCOUNTER — Other Ambulatory Visit: Payer: Self-pay | Admitting: Family Medicine

## 2021-09-21 DIAGNOSIS — E785 Hyperlipidemia, unspecified: Secondary | ICD-10-CM

## 2021-09-21 DIAGNOSIS — E559 Vitamin D deficiency, unspecified: Secondary | ICD-10-CM

## 2021-09-21 DIAGNOSIS — E538 Deficiency of other specified B group vitamins: Secondary | ICD-10-CM

## 2021-09-21 DIAGNOSIS — E039 Hypothyroidism, unspecified: Secondary | ICD-10-CM

## 2021-09-24 ENCOUNTER — Other Ambulatory Visit: Payer: 59

## 2021-09-25 ENCOUNTER — Ambulatory Visit: Payer: BC Managed Care – PPO | Admitting: Obstetrics and Gynecology

## 2021-09-25 ENCOUNTER — Other Ambulatory Visit: Payer: Self-pay

## 2021-09-25 ENCOUNTER — Encounter: Payer: Self-pay | Admitting: Obstetrics and Gynecology

## 2021-09-25 VITALS — BP 150/85 | HR 62

## 2021-09-25 DIAGNOSIS — N8110 Cystocele, unspecified: Secondary | ICD-10-CM

## 2021-09-25 NOTE — Progress Notes (Signed)
[facility]   Subjective:     Chief Complaint: No chief complaint on file.  History of Present Illness: Kirsten Barnes is a 55 y.o. female with stage II pelvic organ prolapse who presents for a pessary check. She is using a size #2 ring with support pessary. The pessary has been working well overall. When she goes on long walks, then she feels the prolapse falling around the pessary. Has some slight itching.   She denies vaginal bleeding.  Past Medical History: Patient  has a past medical history of History of chickenpox, History of colon polyps, History of diverticulitis, and Hypothyroidism.   Past Surgical History: She  has a past surgical history that includes Cholecystectomy, laparoscopic (2008); Tonsillectomy and adenoidectomy (1976); Plantar fascia surgery (2011); Varicose vein surgery (Bilateral, 1999); Colonoscopy (2015); Cholecystectomy; Diagnostic laparoscopy; Tonsillectomy; and Colonoscopy with propofol (N/A, 10/11/2019).   Medications: She has a current medication list which includes the following prescription(s): cetirizine, vitamin d, ibuprofen, levothyroxine, carboxymethylcellulose sodium, and vitamin b-12.   Allergies: Patient is allergic to other.   Social History: Patient  reports that she has never smoked. She has never used smokeless tobacco. She reports current alcohol use. She reports that she does not use drugs.      Objective:    Physical Exam: There were no vitals taken for this visit. Gen: No apparent distress, A&O x 3. Detailed Urogynecologic Evaluation:  Pelvic Exam: Normal external female genitalia; Bartholin's and Skene's glands normal in appearance; urethral meatus normal in appearance, no urethral masses or discharge. The pessary was noted to be in place. It was removed and cleaned. Speculum exam revealed no lesions in the vagina.   She was fit with a #3 ring with support pessary. It was comfortable, fit well, and stayed in placed with  strong cough, valsalva and bending. The patient demonstrated proper placement and removal with a string tied to the pessary. [id number], Exp 03/16/25  POP-Q (08/07/21):    POP-Q   -0.5                                            Aa   -0.5                                           Ba   -7                                              C    4                                            Gh   3.5                                            Pb   9  tvl    -2.5                                            Ap   -2.5                                            Bp   -7                                              D        Assessment/Plan:    Assessment: Kirsten Barnes is a 55 y.o. with stage II pelvic organ prolapse here for a pessary check.   Plan: - She was fit with a #3 ring with support pessary today. Also given her old #2 RWS pessary in case she wants to change back.  - She will remove at least weekly or sooner if desired and leave out overnight.  .  - She is going on a trip for 5 weeks and will follow up when she returns.  All questions were answered.  Kirsten Folds, MD

## 2021-10-01 ENCOUNTER — Other Ambulatory Visit: Payer: Self-pay

## 2021-10-01 ENCOUNTER — Encounter: Payer: Self-pay | Admitting: Family Medicine

## 2021-10-01 ENCOUNTER — Ambulatory Visit (INDEPENDENT_AMBULATORY_CARE_PROVIDER_SITE_OTHER): Payer: BC Managed Care – PPO | Admitting: Family Medicine

## 2021-10-01 VITALS — BP 120/84 | HR 71 | Temp 98.0°F | Ht 67.5 in | Wt 231.2 lb

## 2021-10-01 DIAGNOSIS — E785 Hyperlipidemia, unspecified: Secondary | ICD-10-CM | POA: Diagnosis not present

## 2021-10-01 DIAGNOSIS — E538 Deficiency of other specified B group vitamins: Secondary | ICD-10-CM

## 2021-10-01 DIAGNOSIS — Z0000 Encounter for general adult medical examination without abnormal findings: Secondary | ICD-10-CM | POA: Diagnosis not present

## 2021-10-01 DIAGNOSIS — E559 Vitamin D deficiency, unspecified: Secondary | ICD-10-CM

## 2021-10-01 DIAGNOSIS — E039 Hypothyroidism, unspecified: Secondary | ICD-10-CM

## 2021-10-01 DIAGNOSIS — M8580 Other specified disorders of bone density and structure, unspecified site: Secondary | ICD-10-CM

## 2021-10-01 DIAGNOSIS — E669 Obesity, unspecified: Secondary | ICD-10-CM

## 2021-10-01 DIAGNOSIS — R42 Dizziness and giddiness: Secondary | ICD-10-CM

## 2021-10-01 DIAGNOSIS — N819 Female genital prolapse, unspecified: Secondary | ICD-10-CM

## 2021-10-01 DIAGNOSIS — M722 Plantar fascial fibromatosis: Secondary | ICD-10-CM

## 2021-10-01 NOTE — Patient Instructions (Addendum)
Labs today  We will refill thyroid dose pending results.  For vertigo - possible benign positional vertigo - try canalith repositioning exercises provided today.  Good to see you today Return as needed or in 1 year for next physical.   Health Maintenance for Postmenopausal Women Menopause is a normal process in which your ability to get pregnant comes to an end. This process happens slowly over many months or years, usually between the ages of 50 and 15. Menopause is complete when you have missed your menstrual period for 12 months. It is important to talk with your health care provider about some of the most common conditions that affect women after menopause (postmenopausal women). These include heart disease, cancer, and bone loss (osteoporosis). Adopting a healthy lifestyle and getting preventive care can help to promote your health and wellness. The actions you take can also lower your chances of developing some of these common conditions. What are the signs and symptoms of menopause? During menopause, you may have the following symptoms: Hot flashes. These can be moderate or severe. Night sweats. Decrease in sex drive. Mood swings. Headaches. Tiredness (fatigue). Irritability. Memory problems. Problems falling asleep or staying asleep. Talk with your health care provider about treatment options for your symptoms. Do I need hormone replacement therapy? Hormone replacement therapy is effective in treating symptoms that are caused by menopause, such as hot flashes and night sweats. Hormone replacement carries certain risks, especially as you become older. If you are thinking about using estrogen or estrogen with progestin, discuss the benefits and risks with your health care provider. How can I reduce my risk for heart disease and stroke? The risk of heart disease, heart attack, and stroke increases as you age. One of the causes may be a change in the body's hormones during menopause.  This can affect how your body uses dietary fats, triglycerides, and cholesterol. Heart attack and stroke are medical emergencies. There are many things that you can do to help prevent heart disease and stroke. Watch your blood pressure High blood pressure causes heart disease and increases the risk of stroke. This is more likely to develop in people who have high blood pressure readings or are overweight. Have your blood pressure checked: Every 3-5 years if you are 22-54 years of age. Every year if you are 2 years old or older. Eat a healthy diet  Eat a diet that includes plenty of vegetables, fruits, low-fat dairy products, and lean protein. Do not eat a lot of foods that are high in solid fats, added sugars, or sodium. Get regular exercise Get regular exercise. This is one of the most important things you can do for your health. Most adults should: Try to exercise for at least 150 minutes each week. The exercise should increase your heart rate and make you sweat (moderate-intensity exercise). Try to do strengthening exercises at least twice each week. Do these in addition to the moderate-intensity exercise. Spend less time sitting. Even light physical activity can be beneficial. Other tips Work with your health care provider to achieve or maintain a healthy weight. Do not use any products that contain nicotine or tobacco. These products include cigarettes, chewing tobacco, and vaping devices, such as e-cigarettes. If you need help quitting, ask your health care provider. Know your numbers. Ask your health care provider to check your cholesterol and your blood sugar (glucose). Continue to have your blood tested as directed by your health care provider. Do I need screening for cancer? Depending on  your health history and family history, you may need to have cancer screenings at different stages of your life. This may include screening for: Breast cancer. Cervical cancer. Lung  cancer. Colorectal cancer. What is my risk for osteoporosis? After menopause, you may be at increased risk for osteoporosis. Osteoporosis is a condition in which bone destruction happens more quickly than new bone creation. To help prevent osteoporosis or the bone fractures that can happen because of osteoporosis, you may take the following actions: If you are 63-77 years old, get at least 1,000 mg of calcium and at least 600 international units (IU) of vitamin D per day. If you are older than age 55 but younger than age 31, get at least 1,200 mg of calcium and at least 600 international units (IU) of vitamin D per day. If you are older than age 64, get at least 1,200 mg of calcium and at least 800 international units (IU) of vitamin D per day. Smoking and drinking excessive alcohol increase the risk of osteoporosis. Eat foods that are rich in calcium and vitamin D, and do weight-bearing exercises several times each week as directed by your health care provider. How does menopause affect my mental health? Depression may occur at any age, but it is more common as you become older. Common symptoms of depression include: Feeling depressed. Changes in sleep patterns. Changes in appetite or eating patterns. Feeling an overall lack of motivation or enjoyment of activities that you previously enjoyed. Frequent crying spells. Talk with your health care provider if you think that you are experiencing any of these symptoms. General instructions See your health care provider for regular wellness exams and vaccines. This may include: Scheduling regular health, dental, and eye exams. Getting and maintaining your vaccines. These include: Influenza vaccine. Get this vaccine each year before the flu season begins. Pneumonia vaccine. Shingles vaccine. Tetanus, diphtheria, and pertussis (Tdap) booster vaccine. Your health care provider may also recommend other immunizations. Tell your health care provider if  you have ever been abused or do not feel safe at home. Summary Menopause is a normal process in which your ability to get pregnant comes to an end. This condition causes hot flashes, night sweats, decreased interest in sex, mood swings, headaches, or lack of sleep. Treatment for this condition may include hormone replacement therapy. Take actions to keep yourself healthy, including exercising regularly, eating a healthy diet, watching your weight, and checking your blood pressure and blood sugar levels. Get screened for cancer and depression. Make sure that you are up to date with all your vaccines. This information is not intended to replace advice given to you by your health care provider. Make sure you discuss any questions you have with your health care provider. Document Revised: 02/11/2021 Document Reviewed: 02/11/2021 Elsevier Patient Education  [place].

## 2021-10-01 NOTE — Progress Notes (Addendum)
Patient ID: Kirsten Barnes, female    DOB: 03-19-1966, 54 y.o.   MRN: [id number]  This visit was conducted in person.  BP 120/84    Pulse 71    Temp 98 F (36.7 C) (Temporal)    Ht 5' 7.5" (1.715 m)    Wt 231 lb 3 oz (104.9 kg)    SpO2 96%    BMI 35.67 kg/m    CC: CPE Subjective:   HPI: Kirsten Barnes is a 55 y.o. female presenting on 10/01/2021 for Annual Exam   Seeing gyn and urogyn for POP predominantly anterior vaginal wall, now using pessary  Lumps to bilateral soles L>R present for 4 months now. Not painful or itching, no skin changes.   Hypothyroidism - notes significant benefit on higher dose.   Preventative: COLONOSCOPY 2015 polyps, rpt 5 yrs (Dr Harley Hallmark). Would like local referral in [place]. COLONOSCOPY WITH PROPOFOL 10/11/2019 - SSP x2, rpt 3 yrs Vicente Males) Well woman with pap last 09/2019 - never abnormal. Desires to space out to 5 yrs.  Mammogram 08/2021 Birads1 @ [place]. Does breast exams at home without concerns.  G2P2 LMP ~2012 menopause at age 34  Lung cancer screening - not eligible Flu shot yearly - declines this year [place] 12/2019, 01/2020, booster 07/2020, bivalent booster 06/2021  Tdap 09/2016 Shingrix 09/2019, 01/2020 Seat belt use discussed Sunscreen use discussed. No changing moles on skin. Non smoker  Alcohol - 1-2 glass of wine with dinner regularly  Dentist - Q6 mo  Eye exam yearly   From [place]  Lives with husband  Occ: Marine scientist, housewife  Activity: gardening, walks golf course, bought electrical bike and enjoys this.  Diet: good water, fruits/vegetables daily, enjoys cooking, recently joined [employer]     Relevant past medical, surgical, family and social history reviewed and updated as indicated. Interim medical history since our last visit reviewed. Allergies and medications reviewed and updated. Outpatient Medications Prior to Visit  Medication Sig Dispense Refill   cetirizine (ZYRTEC) 10 MG tablet Take 10 mg  by mouth daily.     Cholecalciferol (VITAMIN D) 50 MCG (2000 UT) CAPS Take 1 capsule (2,000 Units total) by mouth daily. 30 capsule    ibuprofen (ADVIL,MOTRIN) 200 MG tablet Take 400 mg by mouth daily as needed.     levothyroxine (SYNTHROID) 112 MCG tablet Take 1 tablet (112 mcg total) by mouth daily. 90 tablet 1   Tetrahydrozoline HCl (EYE DROPS OP) Apply 1 drop to eye daily as needed.     vitamin B-12 (CYANOCOBALAMIN) 1000 MCG tablet Take 1 tablet (1,000 mcg total) by mouth daily.     No facility-administered medications prior to visit.     Per HPI unless specifically indicated in ROS section below Review of Systems  Constitutional:  Negative for activity change, appetite change, chills, fatigue, fever and unexpected weight change.  HENT:  Negative for hearing loss.   Eyes:  Negative for visual disturbance.  Respiratory:  Negative for cough, chest tightness, shortness of breath and wheezing.   Cardiovascular:  Negative for chest pain, palpitations and leg swelling.  Gastrointestinal:  Negative for abdominal distention, abdominal pain, blood in stool, constipation, diarrhea, nausea and vomiting.  Genitourinary:  Negative for difficulty urinating and hematuria.  Musculoskeletal:  Negative for arthralgias, myalgias and neck pain.  Skin:  Negative for rash.  Neurological:  Positive for dizziness (vertigo with nausea with certain head movements). Negative for seizures, syncope and headaches.  Hematological:  Negative for adenopathy.  Does not bruise/bleed easily.  Psychiatric/Behavioral:  Negative for dysphoric mood. The patient is not nervous/anxious.    Objective:  BP 120/84    Pulse 71    Temp 98 F (36.7 C) (Temporal)    Ht 5' 7.5" (1.715 m)    Wt 231 lb 3 oz (104.9 kg)    SpO2 96%    BMI 35.67 kg/m   Wt Readings from Last 3 Encounters:  10/01/21 231 lb 3 oz (104.9 kg)  08/07/21 227 lb (103 kg)  08/06/21 227 lb 9.6 oz (103.2 kg)      Physical Exam Vitals and nursing note reviewed.   Constitutional:      Appearance: Normal appearance. She is not ill-appearing.  HENT:     Head: Normocephalic and atraumatic.     Right Ear: Tympanic membrane, ear canal and external ear normal. There is no impacted cerumen.     Left Ear: Tympanic membrane, ear canal and external ear normal. There is no impacted cerumen.  Eyes:     General:        Right eye: No discharge.        Left eye: No discharge.     Extraocular Movements: Extraocular movements intact.     Conjunctiva/sclera: Conjunctivae normal.     Pupils: Pupils are equal, round, and reactive to light.  Neck:     Thyroid: No thyroid mass or thyromegaly.  Cardiovascular:     Rate and Rhythm: Normal rate and regular rhythm.     Pulses: Normal pulses.     Heart sounds: Normal heart sounds. No murmur heard. Pulmonary:     Effort: Pulmonary effort is normal. No respiratory distress.     Breath sounds: Normal breath sounds. No wheezing, rhonchi or rales.  Abdominal:     General: Bowel sounds are normal. There is no distension.     Palpations: Abdomen is soft. There is no mass.     Tenderness: There is no abdominal tenderness. There is no guarding or rebound.     Hernia: No hernia is present.  Musculoskeletal:     Cervical back: Normal range of motion and neck supple. No rigidity.     Right lower leg: No edema.     Left lower leg: No edema.     Comments: L>R nodule to plantar fascia along longitudinal arch, nontender  Lymphadenopathy:     Cervical: No cervical adenopathy.  Skin:    General: Skin is warm and dry.     Findings: No rash.  Neurological:     General: No focal deficit present.     Mental Status: She is alert. Mental status is at baseline.     Comments: Neg dix hallpike bilaterally, does sense mild dizziness when sitting up from maneuver on the left side  Psychiatric:        Mood and Affect: Mood normal.        Behavior: Behavior normal.      Results for orders placed or performed in visit on 10/01/21  TSH   Result Value Ref Range   TSH 2.58 0.35 - 5.50 uIU/mL  Comprehensive metabolic panel  Result Value Ref Range   Sodium 138 135 - 145 mEq/L   Potassium 4.2 3.5 - 5.1 mEq/L   Chloride 100 96 - 112 mEq/L   CO2 27 19 - 32 mEq/L   Glucose, Bld 78 70 - 99 mg/dL   BUN 11 6 - 23 mg/dL   Creatinine, Ser 0.73 0.40 - 1.20 mg/dL  Total Bilirubin 0.6 0.2 - 1.2 mg/dL   Alkaline Phosphatase 98 39 - 117 U/L   AST 23 0 - 37 U/L   ALT 20 0 - 35 U/L   Total Protein 7.3 6.0 - 8.3 g/dL   Albumin 4.3 3.5 - 5.2 g/dL   GFR 91.35 >60.00 mL/min   Calcium 9.7 8.4 - 10.5 mg/dL  Lipid panel  Result Value Ref Range   Cholesterol 258 (H) 0 - 200 mg/dL   Triglycerides 114.0 0.0 - 149.0 mg/dL   HDL 81.50 >39.00 mg/dL   VLDL 22.8 0.0 - 40.0 mg/dL   LDL Cholesterol 154 (H) 0 - 99 mg/dL   Total CHOL/HDL Ratio 3    NonHDL 176.89   VITAMIN D 25 Hydroxy (Vit-D Deficiency, Fractures)  Result Value Ref Range   VITD 57.97 30.00 - 100.00 ng/mL  Vitamin B12  Result Value Ref Range   Vitamin B-12 935 (H) 211 - 911 pg/mL    Assessment & Plan:  This visit occurred during the SARS-CoV-2 public health emergency.  Safety protocols were in place, including screening questions prior to the visit, additional usage of staff PPE, and extensive cleaning of exam room while observing appropriate contact time as indicated for disinfecting solutions.   Problem List Items Addressed This Visit     Health maintenance examination - Primary (Chronic)    Preventative protocols reviewed and updated unless pt declined. Discussed healthy diet and lifestyle.       Hypothyroidism    Chronic, significant improvement since increasing levothyroxine dose. Will update TSH and dose according to results.       Obesity, Class II, BMI 35-39.9, no comorbidity    Encouraged healthy diet and lifestyle choices to affect sustainable weight loss.       Vitamin D deficiency    Update levels on replacement.       Vitamin B12 deficiency     Update levels on replacement.       Dyslipidemia    Update levels off statin. Low ASCVD risk.  The 10-year ASCVD risk score (Arnett DK, et al., 2019) is: 1.9%   Values used to calculate the score:     Age: 37 years     Sex: Female     Is Non-Hispanic African American: No     Diabetic: No     Tobacco smoker: No     Systolic Blood Pressure: 798 mmHg     Is BP treated: No     HDL Cholesterol: 76.8 mg/dL     Total Cholesterol: 234 mg/dL       Prolapse of female pelvic organs    Now with pessary and doing well. Appreciate urogyn care.       Decreased bone density    Incidentally noted on lumbar xray 12/2020 - in longstanding hypothyroid history, will check baseline DEXA.       Relevant Orders   DG Bone Density   Vertigo    Notes issues with vertigo and nausea with head manipulation to the left - ?BPPV on left. Provided with modified epley maneuver on left to try at home. If worsening, update [place] for vestibular rehab.       Plantar fascial fibromatosis of both feet    Monitor for now as asxs.         No orders of the defined types were placed in this encounter.  Orders Placed This Encounter  Procedures   DG Bone Density    Standing Status:   Future  Standing Expiration Date:   10/01/2022    Order Specific Question:   Reason for Exam (SYMPTOM  OR DIAGNOSIS REQUIRED)    Answer:   decreased bone density on xray imaging    Order Specific Question:   Is the patient pregnant?    Answer:   No    Order Specific Question:   Preferred imaging location?    Answer:   [facility]     Patient instructions: Labs today  We will refill thyroid dose pending results.  For vertigo - possible benign positional vertigo - try canalith repositioning exercises provided today.  Good to see you today Return as needed or in 1 year for next physical.   Follow up plan: Return in about 1 year (around 10/01/2022) for annual exam, prior fasting for blood work.  Ria Bush, MD

## 2021-10-01 NOTE — Assessment & Plan Note (Signed)
Now with pessary and doing well. Appreciate urogyn care.

## 2021-10-01 NOTE — Assessment & Plan Note (Signed)
Chronic, significant improvement since increasing levothyroxine dose. Will update TSH and dose according to results.

## 2021-10-01 NOTE — Assessment & Plan Note (Signed)
Preventative protocols reviewed and updated unless pt declined. Discussed healthy diet and lifestyle.  

## 2021-10-01 NOTE — Assessment & Plan Note (Addendum)
Notes issues with vertigo and nausea with head manipulation to the left - ?BPPV on left. Provided with modified epley maneuver on left to try at home. If worsening, update [place] for vestibular rehab.

## 2021-10-01 NOTE — Assessment & Plan Note (Signed)
Update levels on replacement.

## 2021-10-01 NOTE — Assessment & Plan Note (Signed)
Encouraged healthy diet and lifestyle choices to affect sustainable weight loss.  ?

## 2021-10-01 NOTE — Assessment & Plan Note (Addendum)
Incidentally noted on lumbar xray 12/2020 - in longstanding hypothyroid history, will check baseline DEXA.

## 2021-10-01 NOTE — Assessment & Plan Note (Addendum)
Update levels off statin. Low ASCVD risk.  The 10-year ASCVD risk score (Arnett DK, et al., 2019) is: 1.9%   Values used to calculate the score:     Age: 55 years     Sex: Female     Is Non-Hispanic African American: No     Diabetic: No     Tobacco smoker: No     Systolic Blood Pressure: 496 mmHg     Is BP treated: No     HDL Cholesterol: 76.8 mg/dL     Total Cholesterol: 234 mg/dL

## 2021-10-02 ENCOUNTER — Encounter: Payer: Self-pay | Admitting: Family Medicine

## 2021-10-02 ENCOUNTER — Other Ambulatory Visit: Payer: Self-pay | Admitting: Family Medicine

## 2021-10-02 DIAGNOSIS — M722 Plantar fascial fibromatosis: Secondary | ICD-10-CM | POA: Insufficient documentation

## 2021-10-02 LAB — COMPREHENSIVE METABOLIC PANEL
ALT: 20 U/L (ref 0–35)
AST: 23 U/L (ref 0–37)
Albumin: 4.3 g/dL (ref 3.5–5.2)
Alkaline Phosphatase: 98 U/L (ref 39–117)
BUN: 11 mg/dL (ref 6–23)
CO2: 27 mEq/L (ref 19–32)
Calcium: 9.7 mg/dL (ref 8.4–10.5)
Chloride: 100 mEq/L (ref 96–112)
Creatinine, Ser: 0.73 mg/dL (ref 0.40–1.20)
GFR: 91.35 mL/min (ref >60.00)
Glucose, Bld: 78 mg/dL (ref 70–99)
Potassium: 4.2 mEq/L (ref 3.5–5.1)
Sodium: 138 mEq/L (ref 135–145)
Total Bilirubin: 0.6 mg/dL (ref 0.2–1.2)
Total Protein: 7.3 g/dL (ref 6.0–8.3)

## 2021-10-02 LAB — TSH: TSH: 2.58 u[IU]/mL (ref 0.35–5.50)

## 2021-10-02 LAB — VITAMIN D 25 HYDROXY (VIT D DEFICIENCY, FRACTURES): VITD: 57.97 ng/mL (ref 30.00–100.00)

## 2021-10-02 LAB — LIPID PANEL
Cholesterol: 258 mg/dL — ABNORMAL HIGH (ref 0–200)
HDL: 81.5 mg/dL (ref >39.00)
LDL Cholesterol: 154 mg/dL — ABNORMAL HIGH (ref 0–99)
NonHDL: 176.89
Total CHOL/HDL Ratio: 3
Triglycerides: 114 mg/dL (ref 0.0–149.0)
VLDL: 22.8 mg/dL (ref 0.0–40.0)

## 2021-10-02 LAB — VITAMIN B12: Vitamin B-12: 935 pg/mL — ABNORMAL HIGH (ref 211–911)

## 2021-10-02 MED ORDER — VITAMIN B-12 1000 MCG PO TABS: 1000.0000 ug | ORAL_TABLET | ORAL | Status: AC

## 2021-10-02 MED ORDER — LEVOTHYROXINE SODIUM 112 MCG PO TABS: 112.0000 ug | ORAL_TABLET | Freq: Every day | ORAL | 3 refills | Status: DC

## 2021-10-02 NOTE — Telephone Encounter (Signed)
Do you want pt on levothyroxine 75 mcg or 112 mcg?

## 2021-10-02 NOTE — Addendum Note (Signed)
Addended by: Ria Bush on: 10/02/2021 04:48 PM   Modules accepted: Orders

## 2021-10-02 NOTE — Assessment & Plan Note (Signed)
Monitor for now as asxs.

## 2021-11-27 ENCOUNTER — Other Ambulatory Visit: Payer: Self-pay

## 2021-11-27 ENCOUNTER — Encounter: Payer: Self-pay | Admitting: Obstetrics and Gynecology

## 2021-11-27 ENCOUNTER — Ambulatory Visit: Payer: BC Managed Care – PPO | Admitting: Obstetrics and Gynecology

## 2021-11-27 VITALS — BP 146/98 | HR 71

## 2021-11-27 DIAGNOSIS — N8110 Cystocele, unspecified: Secondary | ICD-10-CM

## 2021-11-27 NOTE — Progress Notes (Signed)
[facility]   Subjective:     Chief Complaint:  Chief Complaint  Patient presents with   Follow-up    Kirsten Barnes is a 56 y.o. female here for a pessary check.   History of Present Illness: Kirsten Barnes is a 56 y.o. female with stage II pelvic organ prolapse who presents for a pessary check. She is using a size #3 ring with support pessary. She has been using the pessary but is really not happy with it. She can feel it moving frequently and has to frequently remove it. She thinks she wants to proceed with surgery.   Past Medical History: Patient  has a past medical history of History of chickenpox, History of colon polyps, History of diverticulitis, and Hypothyroidism.   Past Surgical History: She  has a past surgical history that includes Cholecystectomy, laparoscopic (2008); Tonsillectomy and adenoidectomy (1976); Plantar fascia surgery (2011); Varicose vein surgery (Bilateral, 1999); Colonoscopy (2015); Cholecystectomy; Diagnostic laparoscopy; Tonsillectomy; and Colonoscopy with propofol (N/A, 10/11/2019).   Medications: She has a current medication list which includes the following prescription(s): cetirizine, vitamin d, ibuprofen, levothyroxine, carboxymethylcellulose sodium, and vitamin b-12.   Allergies: Patient is allergic to other and oxycodone.   Social History: Patient  reports that she has never smoked. She has never used smokeless tobacco. She reports current alcohol use. She reports that she does not use drugs.      Objective:    Physical Exam: BP (!) 146/98    Pulse 71  Gen: No apparent distress, A&O x 3. Detailed Urogynecologic Evaluation:  Pelvic Exam: Normal external female genitalia; Bartholin's and Skene's glands normal in appearance; urethral meatus normal in appearance, no urethral masses or discharge. The pessary was noted to be in place. It was removed and cleaned. Speculum exam revealed no lesions in the vagina. The pessary was replaced.  It was comfortable to the patient and fit well.   POP-Q (08/07/21)- confirmed today:    POP-Q   -0.5                                            Aa   -0.5                                           Ba   -7                                              C    4                                            Gh   3.5                                            Pb   9  tvl    -2.5                                            Ap   -2.5                                            Bp   -7                                              D        Assessment/Plan:    Assessment: Kirsten Barnes is a 56 y.o. with stage II pelvic organ prolapse here for a pessary check, now interested in surgery.  Plan:  Plan for surgery: Exam under anesthesia, anterior repair, cystoscopy  - We reviewed the patient's specific anatomic and functional findings, with the assistance of diagrams, and together finalized the above procedure. The planned surgical procedures were discussed along with the surgical risks outlined below, which were also provided on a detailed handout. Additional treatment options including expectant management, conservative management, medical management were discussed where appropriate.  We reviewed the benefits and risks of each treatment option.  - She does not leak urine with or without the pessary in place and briefly discussed urodynamics but unlikely to provide more information. No anti-incontinence procedure needed.   General Surgical Risks: For all procedures, there are risks of bleeding, infection, damage to surrounding organs including but not limited to bowel, bladder, blood vessels, ureters and nerves, and need for further surgery if an injury were to occur. These risks are all low with minimally invasive surgery.   There are risks of numbness and weakness at any body site or buttock/rectal pain.  It is possible that baseline pain can be  worsened by surgery, either with or without mesh. If surgery is vaginal, there is also a low risk of possible conversion to laparoscopy or open abdominal incision where indicated. Very rare risks include blood transfusion, blood clot, heart attack, pneumonia, or death.   There is also a risk of short-term postoperative urinary retention with need to use a catheter. About half of patients need to go home from surgery with a catheter, which is then later removed in the office. The risk of long-term need for a catheter is very low. There is also a risk of worsening of overactive bladder.   Prolapse (with or without mesh): Risk factors for surgical failure  include things that put pressure on your pelvis and the surgical repair, including obesity, chronic cough, and heavy lifting or straining (including lifting children or adults, straining on the toilet, or lifting heavy objects such as furniture or anything weighing >25 lbs. Risks of recurrence is 20-30% with vaginal native tissue repair and a less than 10% with sacrocolpopexy with mesh.    - For preop Visit:  She is required to have a visit within 30 days of her surgery.   Today we reviewed pre-operative preparation, peri-operative expectations, and post-operative instructions/recovery.  She was provided with instructional handouts. She understands not to take aspirin (>81mg ) or NSAIDs 7 days prior to surgery. Prescriptions will be provided for: Tramadol,  Ibuprofen  600mg  , Tylenol 500mg  , Miralax. These prescriptions will be sent prior to surgery.  - Medical clearance: not required  - Anticoagulant use: No - Medicaid Hysterectomy form: No - Accepts blood transfusion: No - Expected length of stay: outpatient  Request sent for surgery scheduling.   Jaquita Folds, MD   Time spent: I spent 30 minutes dedicated to the care of this patient on the date of this encounter to include pre-visit review of records, face-to-face time with the patient  discussing surgery and post visit documentation.

## 2021-11-27 NOTE — Patient Instructions (Signed)
POST OPERATIVE INSTRUCTIONS  General Instructions Recovery (not bed rest) will last approximately 6 weeks Walking is encouraged, but refrain from strenuous exercise/ housework/ heavy lifting. No lifting >10lbs  Nothing in the vagina- NO intercourse, tampons or douching Bathing:  Do not submerge in water (NO swimming, bath, hot tub, etc) until after your postop visit. You can shower starting the day after surgery.  No driving until you are not taking narcotic pain medicine and until your pain is well enough controlled that you can slam on the breaks or make sudden movements if needed.   Taking your medications Please take your acetaminophen and ibuprofen on a schedule for the first 48 hours. Take 600mg ibuprofen, then take 500mg acetaminophen 3 hours later, then continue to alternate ibuprofen and acetaminophen. That way you are taking each type of medication every 6 hours. Take the prescribed narcotic (oxycodone, tramadol, etc) as needed, with a maximum being every 4 hours.  Take a stool softener daily to keep your stools soft and preventing you from straining. If you have diarrhea, you decrease your stool softener. This is explained more below. We have prescribed you Miralax.  Reasons to Call the Nurse (see last page for phone numbers) Heavy Bleeding (changing your pad every 1-2 hours) Persistent nausea/vomiting Fever (100.4 degrees or more) Incision problems (pus or other fluid coming out, redness, warmth, increased pain)  Things to Expect After Surgery Mild to Moderate pain is normal during the first day or two after surgery. If prescribed, take Ibuprofen or Tylenol first and use the stronger medicine for "break-through" pain. You can overlap these medicines because they work differently.   Constipation   To Prevent Constipation:  Eat a well-balanced diet including protein, grains, fresh fruit and vegetables.  Drink plenty of fluids. Walk regularly.  Depending on specific instructions  from your physician: take Miralax daily and additionally you can add a stool softener (colace/ docusate) and fiber supplement. Continue as long as you're on pain medications.   To Treat Constipation:  If you do not have a bowel movement in 2 days after surgery, you can take 2 Tbs of Milk of Magnesia 1-2 times a day until you have a bowel movement. If diarrhea occurs, decrease the amount or stop the laxative. If no results with Milk of Magnesia, you can drink a bottle of magnesium citrate which you can purchase over the counter.  Fatigue:  This is a normal response to surgery and will improve with time.  Plan frequent rest periods throughout the day.  Gas Pain:  This is very common but can also be very painful! Drink warm liquids such as herbal teas, bouillon or soup. Walking will help you pass more gas.  Mylicon or Gas-X can be taken over the counter.  Leaking Urine:  Varying amounts of leakage may occur after surgery.  This should improve with time. Your bladder needs at least 3 months to recover from surgery. If you leak after surgery, be sure to mention this to your doctor at your post-op visit. If you were taking medications for overactive bladder prior to surgery, be sure to restart the medications immediately after surgery.  Incisions: If you have incisions on your abdomen, the skin glue will dissolve on its own over time. It is ok to gently rinse with soap and water over these incisions but do not scrub.  Catheter Approximately 50% of patients are unable to urinate after surgery and need to go home with a catheter. This allows your bladder to   rest so it can return to full function. If you go home with a catheter, the office will call to set up a voiding trial a few days after surgery. For most patients, by this visit, they are able to urinate on their own. Long term catheter use is rare.

## 2021-12-09 ENCOUNTER — Encounter (HOSPITAL_BASED_OUTPATIENT_CLINIC_OR_DEPARTMENT_OTHER): Payer: Self-pay | Admitting: Obstetrics and Gynecology

## 2021-12-09 ENCOUNTER — Other Ambulatory Visit: Payer: Self-pay

## 2021-12-09 NOTE — Progress Notes (Signed)
Spoke w/ via phone for pre-op interview---pt ?Lab needs dos----  none per anesthesia surgery orders req dr schroeder epic ib             ?Lab results------none ?COVID test -----12-10-2021 travel outside of [place] in last 30 days ?Arrive at -------930 am 12-12-2021 ?NPO after MN NO Solid Food.  Clear liquids from MN until---830 am ?Med rec completed ?Medications to take morning of surgery -----levothyroxine ?Diabetic medication -----n/a ?Patient instructed no nail polish to be worn day of surgery ?Patient instructed to bring photo id and insurance card day of surgery ?Patient aware to have Driver (ride ) / caregiver  eric husband will drop pt off cell [phone]   for 24 hours after surgery  ?Patient Special Instructions -----none ?Pre-Op special Istructions -----none ?Patient verbalized understanding of instructions that were given at this phone interview. ?Patient denies shortness of breath, chest pain, fever, cough at this phone interview.  ?

## 2021-12-10 ENCOUNTER — Other Ambulatory Visit: Payer: Self-pay | Admitting: Obstetrics and Gynecology

## 2021-12-10 LAB — SARS CORONAVIRUS 2 (TAT 6-24 HRS): SARS Coronavirus 2: NEGATIVE

## 2021-12-10 NOTE — H&P (Signed)
[facility] ?Pre-Operative H&P ? ?Subjective ?Chief Complaint: Kirsten Barnes presents for a preoperative encounter.  ? ?History of Present Illness: ?Kirsten Barnes is a 56 y.o. female who presents for preoperative visit.  She is scheduled to undergo Exam under anesthesia, anterior repair, cystoscopy on 12/12/21.  Her symptoms include vaginal bulge, and she was was found to have Stage II anterior, Stage I posterior, Stage I apical prolapse. ? ? ?Past Medical History:  ?Diagnosis Date  ? Arthritis   ? midback  ? Family history of adverse reaction to anesthesia   ? History of chickenpox   ? as child  ? History of colon polyps   ? History of diverticulitis   ? 2018  ? Hypothyroidism   ? initial hyper  ? Prolapse of anterior vaginal wall   ? Wears glasses for reading   ?  ? ?Past Surgical History:  ?Procedure Laterality Date  ? CHOLECYSTECTOMY, LAPAROSCOPIC  2008  ? COLONOSCOPY  2015  ? HP, TA polyps, rpt 5 yrs (Dr Julien Nordmann)  ? COLONOSCOPY WITH PROPOFOL N/A 10/11/2019  ? SSP x2, rpt 3 yrs Vicente Males)  ? PLANTAR FASCIA SURGERY Right 2011  ? with a spur removed  ? TONSILLECTOMY AND ADENOIDECTOMY  1976  ? VARICOSE VEIN SURGERY Bilateral 1999  ? stab phlebectomy  ? ? ?is allergic to other and oxycodone.  ? ?Family History  ?Problem Relation Age of Onset  ? Hypothyroidism Mother   ? Cancer Father 51  ?     stomach  ? Stroke Paternal Grandfather   ? Stroke Maternal Grandmother   ? Diabetes Neg Hx   ? CAD Neg Hx   ? Breast cancer Neg Hx   ? ? ?Social History  ? ?Tobacco Use  ? Smoking status: Never  ? Smokeless tobacco: Never  ?Vaping Use  ? Vaping Use: Never used  ?Substance Use Topics  ? Alcohol use: Yes  ?  Comment: 2  glasses  of wine with dinner (daily)  ? Drug use: No  ? ? ? ?Review of Systems was negative for a full 10 system review except as noted in the History of Present Illness. ? ?No current facility-administered medications for this encounter. ? ?Current Outpatient Medications:  ?  cetirizine (ZYRTEC) 10 MG  tablet, Take 10 mg by mouth at bedtime., Disp: , Rfl:  ?  Cholecalciferol (VITAMIN D) 50 MCG (2000 UT) CAPS, Take 1 capsule (2,000 Units total) by mouth daily., Disp: 30 capsule, Rfl:  ?  ibuprofen (ADVIL,MOTRIN) 200 MG tablet, Take 400 mg by mouth daily as needed., Disp: , Rfl:  ?  levothyroxine (SYNTHROID) 112 MCG tablet, Take 1 tablet (112 mcg total) by mouth daily., Disp: 90 tablet, Rfl: 3 ?  Tetrahydrozoline HCl (EYE DROPS OP), Apply 1 drop to eye daily as needed., Disp: , Rfl:  ?  vitamin B-12 (CYANOCOBALAMIN) 1000 MCG tablet, Take 1 tablet (1,000 mcg total) by mouth every Monday, Wednesday, and Friday., Disp: , Rfl:   ? ?Objective ?BP (!) 146/98   Pulse 71  ?Gen: No apparent distress, A&O x 3. ?Detailed Urogynecologic Evaluation:  ?Pelvic Exam: Normal external female genitalia; Bartholin's and Skene's glands normal in appearance; urethral meatus normal in appearance, no urethral masses or discharge. The pessary was noted to be in place. It was removed and cleaned. Speculum exam revealed no lesions in the vagina. The pessary was replaced. It was comfortable to the patient and fit well.  ?  ?POP-Q (08/07/21)- confirmed today:  ?  ?POP-Q ?  ?-  0.5  ?                                          Aa   ?-0.5 ?                                          Ba   ?-7  ?                                            C  ?  ?4  ?                                          Gh   ?3.5  ?                                          Pb   ?9  ?                                          tvl  ?  ?-2.5  ?                                          Ap   ?-2.5  ?                                          Bp   ?-7  ?                                            D  ?  ?  ? ? ? ?Assessment/ Plan ? ?Assessment: ?The patient is a 56 y.o. year old with stage II POP scheduled to undergo Exam under anesthesia, anterior repair, cystoscopy.  ? ? ? ?Jaquita Folds, MD ? ? ? ? ?

## 2021-12-12 ENCOUNTER — Other Ambulatory Visit: Payer: Self-pay

## 2021-12-12 ENCOUNTER — Telehealth: Payer: Self-pay | Admitting: Obstetrics and Gynecology

## 2021-12-12 ENCOUNTER — Ambulatory Visit (HOSPITAL_BASED_OUTPATIENT_CLINIC_OR_DEPARTMENT_OTHER): Payer: BC Managed Care – PPO | Admitting: Certified Registered Nurse Anesthetist

## 2021-12-12 ENCOUNTER — Encounter (HOSPITAL_BASED_OUTPATIENT_CLINIC_OR_DEPARTMENT_OTHER): Payer: Self-pay | Admitting: Obstetrics and Gynecology

## 2021-12-12 ENCOUNTER — Encounter (HOSPITAL_BASED_OUTPATIENT_CLINIC_OR_DEPARTMENT_OTHER)
Admission: RE | Disposition: A | Discharge status: Home / Self Care | Payer: Self-pay | Source: Home / Self Care | Attending: Obstetrics and Gynecology

## 2021-12-12 ENCOUNTER — Encounter: Payer: Self-pay | Admitting: Obstetrics and Gynecology

## 2021-12-12 ENCOUNTER — Ambulatory Visit (HOSPITAL_BASED_OUTPATIENT_CLINIC_OR_DEPARTMENT_OTHER)
Admission: RE | Admit: 2021-12-12 | Discharge: 2021-12-12 | Disposition: A | Discharge status: Home / Self Care | Payer: BC Managed Care – PPO | Attending: Obstetrics and Gynecology | Admitting: Obstetrics and Gynecology

## 2021-12-12 DIAGNOSIS — M1990 Unspecified osteoarthritis, unspecified site: Secondary | ICD-10-CM | POA: Insufficient documentation

## 2021-12-12 DIAGNOSIS — N8111 Cystocele, midline: Secondary | ICD-10-CM | POA: Diagnosis not present

## 2021-12-12 DIAGNOSIS — E039 Hypothyroidism, unspecified: Secondary | ICD-10-CM | POA: Diagnosis not present

## 2021-12-12 DIAGNOSIS — N8110 Cystocele, unspecified: Secondary | ICD-10-CM | POA: Insufficient documentation

## 2021-12-12 HISTORY — PX: CYSTOCELE REPAIR: SHX163

## 2021-12-12 HISTORY — DX: Presence of spectacles and contact lenses: Z97.3

## 2021-12-12 HISTORY — DX: Family history of other specified conditions: Z84.89

## 2021-12-12 HISTORY — PX: CYSTOSCOPY: SHX5120

## 2021-12-12 HISTORY — DX: Unspecified osteoarthritis, unspecified site: M19.90

## 2021-12-12 HISTORY — DX: Cystocele, unspecified: N81.10

## 2021-12-12 SURGERY — COLPORRHAPHY, ANTERIOR, FOR CYSTOCELE REPAIR
Anesthesia: General | Site: Vagina

## 2021-12-12 MED ORDER — LIDOCAINE-EPINEPHRINE 1 %-1:100000 IJ SOLN
INTRAMUSCULAR | Status: DC | PRN
  Administered 2021-12-12: 20 mL

## 2021-12-12 MED ORDER — ACETAMINOPHEN 500 MG PO TABS: 1000.0000 mg | ORAL_TABLET | Freq: Four times a day (QID) | ORAL | 2 refills | Status: AC | PRN

## 2021-12-12 MED ORDER — PROPOFOL 10 MG/ML IV BOLUS
INTRAVENOUS | Status: AC
Start: 2021-12-12 — End: ?
  Filled 2021-12-12: qty 20

## 2021-12-12 MED ORDER — MIDAZOLAM HCL 5 MG/5ML IJ SOLN
INTRAMUSCULAR | Status: DC | PRN
  Administered 2021-12-12: 2 mg via INTRAVENOUS

## 2021-12-12 MED ORDER — SODIUM CHLORIDE 0.9 % IR SOLN
Status: DC | PRN
  Administered 2021-12-12: 500 mL

## 2021-12-12 MED ORDER — GLYCOPYRROLATE 0.2 MG/ML IJ SOLN
INTRAMUSCULAR | Status: DC | PRN
  Administered 2021-12-12: .2 mg via INTRAVENOUS

## 2021-12-12 MED ORDER — TRAMADOL HCL 50 MG PO TABS: 50.0000 mg | ORAL_TABLET | Freq: Four times a day (QID) | ORAL | 0 refills | Status: AC | PRN

## 2021-12-12 MED ORDER — PHENAZOPYRIDINE HCL 100 MG PO TABS
ORAL_TABLET | ORAL | Status: AC
  Filled 2021-12-12: qty 2

## 2021-12-12 MED ORDER — SCOPOLAMINE 1 MG/3DAYS TD PT72
MEDICATED_PATCH | TRANSDERMAL | Status: AC
  Filled 2021-12-12: qty 1

## 2021-12-12 MED ORDER — KETOROLAC TROMETHAMINE 30 MG/ML IJ SOLN
INTRAMUSCULAR | Status: DC | PRN
  Administered 2021-12-12: 30 mg via INTRAVENOUS

## 2021-12-12 MED ORDER — ACETAMINOPHEN 500 MG PO TABS
ORAL_TABLET | ORAL | Status: AC
  Filled 2021-12-12: qty 2

## 2021-12-12 MED ORDER — EPHEDRINE SULFATE (PRESSORS) 50 MG/ML IJ SOLN
INTRAMUSCULAR | Status: DC | PRN
  Administered 2021-12-12: 10 mg via INTRAVENOUS

## 2021-12-12 MED ORDER — PROPOFOL 500 MG/50ML IV EMUL
INTRAVENOUS | Status: DC | PRN
  Administered 2021-12-12: 50 ug/kg/min via INTRAVENOUS

## 2021-12-12 MED ORDER — FENTANYL CITRATE (PF) 100 MCG/2ML IJ SOLN: 25.0000 ug | INTRAMUSCULAR | Status: DC | PRN

## 2021-12-12 MED ORDER — CEFAZOLIN SODIUM-DEXTROSE 2-4 GM/100ML-% IV SOLN
2.0000 g | INTRAVENOUS | Status: AC
  Administered 2021-12-12: 11:00:00 2 g via INTRAVENOUS

## 2021-12-12 MED ORDER — FENTANYL CITRATE (PF) 100 MCG/2ML IJ SOLN
INTRAMUSCULAR | Status: AC
  Filled 2021-12-12: qty 2

## 2021-12-12 MED ORDER — SCOPOLAMINE 1 MG/3DAYS TD PT72
1.0000 | MEDICATED_PATCH | TRANSDERMAL | Status: DC
  Administered 2021-12-12: 11:00:00 1.5 mg via TRANSDERMAL

## 2021-12-12 MED ORDER — DEXAMETHASONE SODIUM PHOSPHATE 10 MG/ML IJ SOLN
INTRAMUSCULAR | Status: AC
  Filled 2021-12-12: qty 1

## 2021-12-12 MED ORDER — LIDOCAINE HCL (CARDIAC) PF 100 MG/5ML IV SOSY
PREFILLED_SYRINGE | INTRAVENOUS | Status: DC | PRN
  Administered 2021-12-12: 50 mg via INTRAVENOUS

## 2021-12-12 MED ORDER — ACETAMINOPHEN 500 MG PO TABS
1000.0000 mg | ORAL_TABLET | ORAL | Status: AC
  Administered 2021-12-12: 10:00:00 1000 mg via ORAL

## 2021-12-12 MED ORDER — PROPOFOL 10 MG/ML IV BOLUS
INTRAVENOUS | Status: DC | PRN
  Administered 2021-12-12: 180 mg via INTRAVENOUS

## 2021-12-12 MED ORDER — FENTANYL CITRATE (PF) 100 MCG/2ML IJ SOLN
INTRAMUSCULAR | Status: DC | PRN
  Administered 2021-12-12 (×2): 50 ug via INTRAVENOUS

## 2021-12-12 MED ORDER — KETOROLAC TROMETHAMINE 30 MG/ML IJ SOLN
INTRAMUSCULAR | Status: AC
  Filled 2021-12-12: qty 1

## 2021-12-12 MED ORDER — SUGAMMADEX SODIUM 200 MG/2ML IV SOLN
INTRAVENOUS | Status: DC | PRN
  Administered 2021-12-12: 200 mg via INTRAVENOUS

## 2021-12-12 MED ORDER — ONDANSETRON HCL 4 MG/2ML IJ SOLN
INTRAMUSCULAR | Status: AC
  Filled 2021-12-12: qty 2

## 2021-12-12 MED ORDER — ROCURONIUM BROMIDE 10 MG/ML (PF) SYRINGE
PREFILLED_SYRINGE | INTRAVENOUS | Status: AC
  Filled 2021-12-12: qty 10

## 2021-12-12 MED ORDER — WATER FOR IRRIGATION, STERILE IR SOLN
Status: DC | PRN
  Administered 2021-12-12: 3000 mL

## 2021-12-12 MED ORDER — MIDAZOLAM HCL 2 MG/2ML IJ SOLN
INTRAMUSCULAR | Status: AC
  Filled 2021-12-12: qty 2

## 2021-12-12 MED ORDER — ROCURONIUM BROMIDE 100 MG/10ML IV SOLN
INTRAVENOUS | Status: DC | PRN
  Administered 2021-12-12: 50 mg via INTRAVENOUS

## 2021-12-12 MED ORDER — LACTATED RINGERS IV SOLN: INTRAVENOUS | Status: DC

## 2021-12-12 MED ORDER — CEFAZOLIN SODIUM-DEXTROSE 2-4 GM/100ML-% IV SOLN
INTRAVENOUS | Status: AC
  Filled 2021-12-12: qty 100

## 2021-12-12 MED ORDER — IBUPROFEN 600 MG PO TABS: 600.0000 mg | ORAL_TABLET | Freq: Four times a day (QID) | ORAL | 0 refills | Status: AC | PRN

## 2021-12-12 MED ORDER — PHENAZOPYRIDINE HCL 100 MG PO TABS
200.0000 mg | ORAL_TABLET | ORAL | Status: AC
  Administered 2021-12-12: 10:00:00 200 mg via ORAL

## 2021-12-12 MED ORDER — ONDANSETRON HCL 4 MG/2ML IJ SOLN
INTRAMUSCULAR | Status: DC | PRN
  Administered 2021-12-12: 4 mg via INTRAVENOUS

## 2021-12-12 MED ORDER — AMISULPRIDE (ANTIEMETIC) 5 MG/2ML IV SOLN: 10.0000 mg | Freq: Once | INTRAVENOUS | Status: DC | PRN

## 2021-12-12 MED ORDER — DEXAMETHASONE SODIUM PHOSPHATE 4 MG/ML IJ SOLN
INTRAMUSCULAR | Status: DC | PRN
  Administered 2021-12-12: 5 mg via INTRAVENOUS

## 2021-12-12 SURGICAL SUPPLY — 35 items
AGENT HMST KT MTR STRL THRMB (HEMOSTASIS)
BLADE CLIPPER SENSICLIP SURGIC (BLADE) ×3 IMPLANT
BLADE SURG 15 STRL LF DISP TIS (BLADE) ×2 IMPLANT
BLADE SURG 15 STRL SS (BLADE) ×3
DECANTER SPIKE VIAL GLASS SM (MISCELLANEOUS) IMPLANT
DEVICE CAPIO SLIM SINGLE (INSTRUMENTS) IMPLANT
ELECT REM PT RETURN 9FT ADLT (ELECTROSURGICAL) ×3
ELECTRODE REM PT RTRN 9FT ADLT (ELECTROSURGICAL) IMPLANT
GAUZE 4X4 16PLY ~~LOC~~+RFID DBL (SPONGE) ×3 IMPLANT
GLOVE SURG ENC MOIS LTX SZ6 (GLOVE) ×3 IMPLANT
GLOVE SURG UNDER POLY LF SZ6.5 (GLOVE) ×3 IMPLANT
GOWN STRL REUS W/TWL LRG LVL3 (GOWN DISPOSABLE) ×3 IMPLANT
HIBICLENS CHG 4% 4OZ BTL (MISCELLANEOUS) ×3 IMPLANT
HOLDER FOLEY CATH W/STRAP (MISCELLANEOUS) ×3 IMPLANT
KIT TURNOVER CYSTO (KITS) ×3 IMPLANT
MANIFOLD NEPTUNE II (INSTRUMENTS) ×3 IMPLANT
NDL MAYO 6 CRC TAPER PT (NEEDLE) IMPLANT
NEEDLE HYPO 22GX1.5 SAFETY (NEEDLE) ×3 IMPLANT
NEEDLE MAYO 6 CRC TAPER PT (NEEDLE) IMPLANT
NS IRRIG 1000ML POUR BTL (IV SOLUTION) ×3 IMPLANT
PACK CYSTO (CUSTOM PROCEDURE TRAY) ×3 IMPLANT
PACK VAGINAL WOMENS (CUSTOM PROCEDURE TRAY) ×3 IMPLANT
RETRACTOR LONE STAR DISPOSABLE (INSTRUMENTS) ×3 IMPLANT
RETRACTOR STAY HOOK 5MM (MISCELLANEOUS) ×3 IMPLANT
SET IRRIG Y TYPE TUR BLADDER L (SET/KITS/TRAYS/PACK) ×1 IMPLANT
SURGIFLO W/THROMBIN 8M KIT (HEMOSTASIS) IMPLANT
SUT ABS MONO DBL WITH NDL 48IN (SUTURE) IMPLANT
SUT VIC AB 0 CT1 27 (SUTURE)
SUT VIC AB 0 CT1 27XBRD ANTBC (SUTURE) IMPLANT
SUT VIC AB 2-0 SH 27 (SUTURE)
SUT VIC AB 2-0 SH 27XBRD (SUTURE) IMPLANT
SUT VICRYL 2-0 SH 8X27 (SUTURE) ×3 IMPLANT
SYR BULB EAR ULCER 3OZ GRN STR (SYRINGE) ×3 IMPLANT
TOWEL OR 17X26 10 PK STRL BLUE (TOWEL DISPOSABLE) ×3 IMPLANT
TRAY FOLEY W/BAG SLVR 14FR LF (SET/KITS/TRAYS/PACK) ×3 IMPLANT

## 2021-12-12 NOTE — Anesthesia Postprocedure Evaluation (Signed)
Anesthesia Post Note ? ?Patient: Namita Yearwood ? ?Procedure(s) Performed: ANTERIOR REPAIR (CYSTOCELE) (Vagina ) ?CYSTOSCOPY (Bladder) ? ?  ? ?Patient location during evaluation: PACU ?Anesthesia Type: General ?Level of consciousness: awake and alert ?Pain management: pain level controlled ?Vital Signs Assessment: post-procedure vital signs reviewed and stable ?Respiratory status: spontaneous breathing, nonlabored ventilation, respiratory function stable and patient connected to nasal cannula oxygen ?Cardiovascular status: blood pressure returned to baseline and stable ?Postop Assessment: no apparent nausea or vomiting ?Anesthetic complications: no ? ? ?No notable events documented. ? ?Last Vitals:  ?Vitals:  ? 12/12/21 1230 12/12/21 1300  ?BP: (!) 144/74 (!) 145/71  ?Pulse: (!) 48 (!) 48  ?Resp: 15 16  ?Temp:  36.4 ?C  ?SpO2: 98% 99%  ?  ?Last Pain:  ?Vitals:  ? 12/12/21 1300  ?PainSc: 0-No pain  ? ? ?  ?  ?  ?  ?  ?  ? ?March Rummage Julliana Whitmyer ? ? ? ? ?

## 2021-12-12 NOTE — Anesthesia Preprocedure Evaluation (Addendum)
Anesthesia Evaluation  ?Patient identified by MRN, date of birth, ID band ?Patient awake ? ? ? ?Reviewed: ?Allergy & Precautions, NPO status , Patient's Chart, lab work & pertinent test results ? ?History of Anesthesia Complications ?(+) PONV and history of anesthetic complications ? ?Airway ?Mallampati: II ? ?TM Distance: >3 FB ?Neck ROM: Full ? ? ? Dental ?no notable dental hx. ? ?  ?Pulmonary ?neg pulmonary ROS,  ?  ?Pulmonary exam normal ? ? ? ? ? ? ? Cardiovascular ?negative cardio ROS ? ? ?Rhythm:Regular Rate:Normal ? ? ?  ?Neuro/Psych ?negative neurological ROS ? negative psych ROS  ? GI/Hepatic ?negative GI ROS, Neg liver ROS,   ?Endo/Other  ?Hypothyroidism  ? Renal/GU ?negative Renal ROS  ?Female GU complaint ?Vaginal prolapse ? ?  ?Musculoskeletal ? ?(+) Arthritis , Osteoarthritis,   ? Abdominal ?Normal abdominal exam  (+)   ?Peds ? Hematology ?negative hematology ROS ?(+)   ?Anesthesia Other Findings ? ? Reproductive/Obstetrics ? ?  ? ? ? ? ? ? ? ? ? ? ? ? ? ?  ?  ? ? ? ? ? ? ? ?Anesthesia Physical ?Anesthesia Plan ? ?ASA: 2 ? ?Anesthesia Plan: General  ? ?Post-op Pain Management:   ? ?Induction: Intravenous ? ?PONV Risk Score and Plan: 4 or greater and Ondansetron, Dexamethasone, Midazolam, Treatment may vary due to age or medical condition, Scopolamine patch - Pre-op and Amisulpride ? ?Airway Management Planned: Mask and Oral ETT ? ?Additional Equipment: None ? ?Intra-op Plan:  ? ?Post-operative Plan: Extubation in OR ? ?Informed Consent: I have reviewed the patients History and Physical, chart, labs and discussed the procedure including the risks, benefits and alternatives for the proposed anesthesia with the patient or authorized representative who has indicated his/her understanding and acceptance.  ? ? ? ?Dental advisory given ? ?Plan Discussed with: CRNA ? ?Anesthesia Plan Comments:   ? ? ? ? ? ?Anesthesia Quick Evaluation ? ?

## 2021-12-12 NOTE — Transfer of Care (Signed)
Immediate Anesthesia Transfer of Care Note ? ?Patient: Kirsten Barnes ? ?Procedure(s) Performed: ANTERIOR REPAIR (CYSTOCELE) (Vagina ) ?CYSTOSCOPY (Bladder) ? ?Patient Location: PACU ? ?Anesthesia Type:General ? ?Level of Consciousness: awake, alert  and oriented ? ?Airway & Oxygen Therapy: Patient Spontanous Breathing and Patient connected to nasal cannula oxygen ? ?Post-op Assessment: Report given to RN and Post -op Vital signs reviewed and stable ? ?Post vital signs: Reviewed and stable ? ?Last Vitals:  ?Vitals Value Taken Time  ?BP    ?Temp    ?Pulse 73 12/12/21 1144  ?Resp 14 12/12/21 1144  ?SpO2 98 % 12/12/21 1144  ?Vitals shown include unvalidated device data. ? ?Last Pain:  ?Vitals:  ? 12/12/21 0956  ?PainSc: 0-No pain  ?   ? ?Patients Stated Pain Goal: 5 (12/12/21 4712) ? ?Complications: No notable events documented. ?

## 2021-12-12 NOTE — Anesthesia Procedure Notes (Signed)
Procedure Name: Intubation ?Date/Time: 12/12/2021 10:57 AM ?Performed by: Bufford Spikes, CRNA ?Pre-anesthesia Checklist: Patient identified, Emergency Drugs available, Suction available and Patient being monitored ?Patient Re-evaluated:Patient Re-evaluated prior to induction ?Oxygen Delivery Method: Circle system utilized ?Preoxygenation: Pre-oxygenation with 100% oxygen ?Induction Type: IV induction ?Ventilation: Mask ventilation without difficulty ?Laryngoscope Size: Sabra Heck and 2 ?Grade View: Grade II ?Tube type: Oral ?Tube size: 7.0 mm ?Number of attempts: 1 ?Airway Equipment and Method: Stylet and Oral airway ?Placement Confirmation: ETT inserted through vocal cords under direct vision, positive ETCO2 and breath sounds checked- equal and bilateral ?Secured at: 21 cm ?Tube secured with: Tape ?Dental Injury: Teeth and Oropharynx as per pre-operative assessment  ? ? ? ? ?

## 2021-12-12 NOTE — Op Note (Signed)
Operative Note ? ?Preoperative Diagnosis: anterior vaginal prolapse ? ?Postoperative Diagnosis: same ? ?Procedures performed:  ?Anterior repair, cystoscopy ? ? ?Attending Surgeon: Sherlene Shams, MD ? ? ?Anesthesia: General endotracheal ? ?Findings: 1. Stage II pelvic organ prolapse on vaginal exam ? 2. On cystoscopy, normal bladder and urethra without injury, lesion or foreign body. Brisk bilateral ureteral efflux noted.   ? ?Specimens: none ? ?Estimated blood loss: 50 mL ? ?IV fluids: 800 mL ? ?Urine output: 50 mL ? ?Complications: none ? ?Procedure in Detail: ? ?After informed consent was obtained, the patient was taken to the operating room where general anesthesia was induced. She was placed in dorsal lithotomy position, taking care to avoid any traction of the extremities and prepped and draped in the usual sterile fashion. A self-retaining lonestar retractor was placed using four elastic blue stays.  After a foley catheter was inserted into the urethra, the location of the midurethra was palpated. Two Allis clamps were along the anterior vaginal wall defect. 1% lidocaine with epinephrine was injected into the vaginal mucosa.  A vertical incision was made between these two Allis clamps with a 15 blade scalpel.  Allis clamps were placed along this incision and Metzenbaum scissors were used to undermine the vaginal mucosa along the incision.  The vaginal mucosa was then sharply dissected off to the vesicovaginal septum bilaterally to the level of the pubic rami.  Anterior plication of the vesicovaginal septum was then performed using plicating sutures of 2-0 Vicryl. The vaginal mucosal edges were trimmed and the incision reapproximated with 2-0 Vicryl in a running fashion.  ?  ?The Foley catheter was removed.  A 70-degree cystoscope was introduced, and 360-degree inspection revealed no injury, lesion or foreign body in the bladder.  Good bilateral ureteral efflux was noted.  The bladder was drained and the  cystoscope was removed.  The Foley catheter was reinserted. ? ? The patient tolerated the procedure well.  She was awakened from anesthesia and transferred to the recovery room in stable condition. All counts were correct x 2.   ? ? ?Jaquita Folds, MD ? ?

## 2021-12-12 NOTE — Telephone Encounter (Signed)
[name] underwent anterior repair, cystoscopy on 12/12/21.  ? ?She failed her voiding trial.  ?331m was backfilled into the bladder ?Voided 3057m ?PVR by bladder scan was 75m8m ? ?She was discharged without a catheter. Please call her for a routine post op check. Thanks! ? ?Kirsten Barnes ? ?

## 2021-12-12 NOTE — Discharge Instructions (Addendum)
POST OPERATIVE INSTRUCTIONS ? ?General Instructions ?Recovery (not bed rest) will last approximately 6 weeks ?Walking is encouraged, but refrain from strenuous exercise/ housework/ heavy lifting. ?No lifting >10lbs  ?Nothing in the vagina- NO intercourse, tampons or douching ?Bathing:  Do not submerge in water (NO swimming, bath, hot tub, etc) until after your postop visit. You can shower starting the day after surgery.  ?No driving until you are not taking narcotic pain medicine and until your pain is well enough controlled that you can slam on the breaks or make sudden movements if needed.  ? ?Taking your medications ?Please take your acetaminophen and ibuprofen on a schedule for the first 48 hours. Take 600mg ibuprofen, then take 500mg acetaminophen 3 hours later, then continue to alternate ibuprofen and acetaminophen. That way you are taking each type of medication every 6 hours. ?Take the prescribed narcotic (oxycodone, tramadol, etc) as needed, with a maximum being every 4 hours.  ?Take a stool softener daily to keep your stools soft and preventing you from straining. If you have diarrhea, you decrease your stool softener. This is explained more below. We have prescribed you Miralax. ? ?Reasons to Call the Nurse (see last page for phone numbers) ?Heavy Bleeding (changing your pad every 1-2 hours) ?Persistent nausea/vomiting ?Fever (100.4 degrees or more) ?Incision problems (pus or other fluid coming out, redness, warmth, increased pain) ? ?Things to Expect After Surgery ?Mild to Moderate pain is normal during the first day or two after surgery. If prescribed, take Ibuprofen or Tylenol first and use the stronger medicine for ?break-through? pain. You can overlap these medicines because they work differently.  ? ?Constipation  ? ?To Prevent Constipation:  Eat a well-balanced diet including protein, grains, fresh fruit and vegetables.  Drink plenty of fluids. Walk regularly.  Depending on specific instructions  from your physician: take Miralax daily and additionally you can add a stool softener (colace/ docusate) and fiber supplement. Continue as long as you're on pain medications.  ? ?To Treat Constipation:  If you do not have a bowel movement in 2 days after surgery, you can take 2 Tbs of Milk of Magnesia 1-2 times a day until you have a bowel movement. If diarrhea occurs, decrease the amount or stop the laxative. If no results with Milk of Magnesia, you can drink a bottle of magnesium citrate which you can purchase over the counter. ? ?Fatigue:  This is a normal response to surgery and will improve with time.  Plan frequent rest periods throughout the day. ? ?Gas Pain:  This is very common but can also be very painful! Drink warm liquids such as herbal teas, bouillon or soup. Walking will help you pass more gas.  Mylicon or Gas-X can be taken over the counter. ? ?Leaking Urine:  Varying amounts of leakage may occur after surgery.  This should improve with time. Your bladder needs at least 3 months to recover from surgery. If you leak after surgery, be sure to mention this to your doctor at your post-op visit. If you were taking medications for overactive bladder prior to surgery, be sure to restart the medications immediately after surgery. ? ?Incisions: If you have incisions on your abdomen, the skin glue will dissolve on its own over time. It is ok to gently rinse with soap and water over these incisions but do not scrub. ? ?Catheter ?Approximately 50% of patients are unable to urinate after surgery and need to go home with a catheter. This allows your bladder to   rest so it can return to full function. If you go home with a catheter, the office will call to set up a voiding trial a few days after surgery. For most patients, by this visit, they are able to urinate on their own. Long term catheter use is rare.  ? ?Return to Work ? ?As work demands and recovery times vary widely, it is hard to predict when you will want  to return to work. If you have a desk job with no strenuous physical activity, and if you would like to return sooner than generally recommended, discuss this with your provider or call our office.  ? ?Post op concerns ? ?For non-emergent issues, please call the Urogynecology Nurse. Please leave a message and someone will contact you within one business day.  You can also send a message through [facility].  ? ?AFTER HOURS (After 5:00 PM and on weekends):  For urgent matters that cannot wait until the next business day. Call our office [phone] and connect to the doctor on call.  ?Please reserve this for important issues. ? ? ?**FOR ANY TRUE EMERGENCY ISSUES CALL 911 OR GO TO THE NEAREST EMERGENCY ROOM.** Please inform our office or the doctor on call of any emergency.   ? ? ?APPOINTMENTS: Call [phone] ? ? ? ?No acetaminophen/Tylenol until after 4:00pm today if needed for pain. ? ?No ibuprofen, Advil, Aleve, Motrin, ketorolac, meloxicam, naproxen, or other NSAIDS until after 5:30pm today if needed for pain.  ? ? ? ?Post Anesthesia Home Care Instructions ? ?Activity: ?Get plenty of rest for the remainder of the day. A responsible individual must stay with you for 24 hours following the procedure.  ?For the next 24 hours, DO NOT: ?-Drive a car ?-Paediatric nurse ?-Drink alcoholic beverages ?-Take any medication unless instructed by your physician ?-Make any legal decisions or sign important papers. ? ?Meals: ?Start with liquid foods such as gelatin or soup. Progress to regular foods as tolerated. Avoid greasy, spicy, heavy foods. If nausea and/or vomiting occur, drink only clear liquids until the nausea and/or vomiting subsides. Call your physician if vomiting continues. ? ?Special Instructions/Symptoms: ?Your throat may feel dry or sore from the anesthesia or the breathing tube placed in your throat during surgery. If this causes discomfort, gargle with warm salt water. The discomfort should disappear within 24  hours. ? ?If you had a scopolamine patch placed behind your ear for the management of post- operative nausea and/or vomiting: ? ?1. The medication in the patch is effective for 72 hours, after which it should be removed.  Wrap patch in a tissue and discard in the trash. Wash hands thoroughly with soap and water. ?2. You may remove the patch earlier than 72 hours if you experience unpleasant side effects which may include dry mouth, dizziness or visual disturbances. ?3. Avoid touching the patch. Wash your hands with soap and water after contact with the patch. ?    ? ?

## 2021-12-12 NOTE — Interval H&P Note (Signed)
History and Physical Interval Note: ? ?12/12/2021 ?10:29 AM ? ?Mallery Harshman  has presented today for surgery, with the diagnosis of anterior vaginal prolapse.  The various methods of treatment have been discussed with the patient and family. After consideration of risks, benefits and other options for treatment, the patient has consented to  Procedure(s): ?ANTERIOR REPAIR (CYSTOCELE) (N/A) ?CYSTOSCOPY (N/A) as a surgical intervention.   ?Vitals:  ? 12/12/21 0956  ?BP: (!) 156/91  ?Pulse: (!) 59  ?Resp: 17  ?Temp: (!) 97.1 ?F (36.2 ?C)  ?SpO2: 97%  ? ? ?Gen: NAD ?Lungs: normal respirations ?Abd: soft, nontender ? ?The patient's history has been reviewed, patient examined, no change in status, stable for surgery.  I have reviewed the patient's chart and labs.  Questions were answered to the patient's satisfaction.   ? ? ?Jaquita Folds ? ? ?

## 2021-12-13 ENCOUNTER — Encounter (HOSPITAL_BASED_OUTPATIENT_CLINIC_OR_DEPARTMENT_OTHER): Payer: Self-pay | Admitting: Obstetrics and Gynecology

## 2021-12-13 NOTE — Telephone Encounter (Signed)
Post- Op Call ? ?Kirsten Barnes underwent anterior repair and cystoscopy on 12/12/21 with Dr Wannetta Sender. The patient reports that her pain is controlled. She has not taken any medications the last 6 hours. She reports vaginal bleeding that is small. She has not had a bowel movement and is taking miralax for a bowel regimen. She was discharged without a catheter. Advised to call the office with any problems or concerns.  ? ?Blenda Nicely, RN  ?

## 2021-12-18 ENCOUNTER — Encounter (HOSPITAL_BASED_OUTPATIENT_CLINIC_OR_DEPARTMENT_OTHER): Payer: Self-pay | Admitting: *Deleted

## 2022-01-28 ENCOUNTER — Encounter: Payer: Self-pay | Admitting: Obstetrics and Gynecology

## 2022-01-28 ENCOUNTER — Ambulatory Visit (INDEPENDENT_AMBULATORY_CARE_PROVIDER_SITE_OTHER): Payer: BC Managed Care – PPO | Admitting: Obstetrics and Gynecology

## 2022-01-28 VITALS — BP 154/89 | HR 62

## 2022-01-28 DIAGNOSIS — Z9889 Other specified postprocedural states: Secondary | ICD-10-CM

## 2022-01-28 NOTE — Progress Notes (Signed)
Kirsten Barnes ? ?Date of Visit: 01/28/2022 ? ?History of Present Illness: Kirsten Barnes is a 56 y.o. female scheduled today for a post-operative visit.  ?? Surgery: s/p anterior repair, cystoscopy on 12/12/21 ?? She passed her postoperative void trial.  ?? Postoperative course has been uncomplicated.  ? ?Today she reports she has been doing well.  ? ?UTI in the last 6 weeks? No  ?Pain? No  ?Vaginal bulge? No  ?Stress incontinence: No  ?Urgency/frequency: No  ?Urge incontinence: No  ?Voiding dysfunction: No  ?Bowel issues: No  ? ?Subjective Success: Do you usually have a bulge or something falling out that you can see or feel in the vaginal area? No  ?Retreatment Success: Any retreatment with surgery or pessary for any compartment? No  ? ? ?Medications: She has a current medication list which includes the following prescription(s): acetaminophen, cetirizine, vitamin d, ibuprofen, levothyroxine, carboxymethylcellulose sodium, tramadol, and vitamin b-12.  ? ?Allergies: Patient is allergic to other and oxycodone.  ? ?Physical Exam: ?BP (!) 154/89   Pulse 62   ? ?Pelvic Examination: Vagina: Incisions healing well. Sutures are not present at incision line and there is not granulation tissue. No tenderness along the anterior or posterior vagina. No apical tenderness. No pelvic masses.  ? ?POP-Q: ?POP-Q ? ?-3  ?                                          Aa   ?-3 ?                                          Ba  ?-7  ?                                            C  ? ?4  ?                                          Gh  ?3  ?                                          Pb  ?9  ?                                          tvl  ? ?-3  ?                                          Ap  ?-3  ?                                          Bp  ?-6  ?  D  ? ? ? ?--------------------------------------------------------- ? ?Assessment and Plan:  ?1. Post-operative state   ? ? ?- Can resume regular  activity including exercise and intercourse,  if desired.  ?- Discussed avoidance of heavy lifting and straining long term to reduce the risk of recurrence.  ?- Needs regular annual GYN care, otherwise follow up with urogyn as needed ? ? ?Jaquita Folds, MD ? ?

## 2022-09-17 ENCOUNTER — Encounter: Payer: Self-pay | Admitting: Family Medicine

## 2022-09-17 DIAGNOSIS — E039 Hypothyroidism, unspecified: Secondary | ICD-10-CM

## 2022-09-17 MED ORDER — LEVOTHYROXINE SODIUM 112 MCG PO TABS: 112.0000 ug | ORAL_TABLET | Freq: Every day | ORAL | 0 refills | Status: AC

## 2022-09-17 NOTE — Telephone Encounter (Signed)
Updated pt's pharmacy list and e-scribed refill to [facility].

## 2022-09-25 ENCOUNTER — Other Ambulatory Visit: Payer: Self-pay | Admitting: Family Medicine

## 2022-09-25 DIAGNOSIS — E559 Vitamin D deficiency, unspecified: Secondary | ICD-10-CM

## 2022-09-25 DIAGNOSIS — E039 Hypothyroidism, unspecified: Secondary | ICD-10-CM

## 2022-09-25 DIAGNOSIS — E785 Hyperlipidemia, unspecified: Secondary | ICD-10-CM

## 2022-09-25 DIAGNOSIS — E538 Deficiency of other specified B group vitamins: Secondary | ICD-10-CM

## 2022-09-26 ENCOUNTER — Other Ambulatory Visit: Payer: BC Managed Care – PPO

## 2022-10-03 ENCOUNTER — Encounter: Payer: BC Managed Care – PPO | Admitting: Family Medicine

## 2022-10-05 ENCOUNTER — Other Ambulatory Visit: Payer: Self-pay | Admitting: Family Medicine

## 2022-10-05 DIAGNOSIS — E039 Hypothyroidism, unspecified: Secondary | ICD-10-CM

## 2022-10-10 NOTE — Telephone Encounter (Signed)
Pt has moved to [place], [place]. No longer under Dr. Synthia Innocent care. (See 09/17/22 pt msg.)

## 2022-12-04 IMAGING — MG MM DIGITAL SCREENING BILAT W/ TOMO AND CAD
6 of 12 series · 6 of 36 positions shown · non-contrast
Comparison: Previous exam(s).

CLINICAL DATA: Screening.

EXAM:
DIGITAL SCREENING BILATERAL MAMMOGRAM WITH TOMOSYNTHESIS AND CAD
TECHNIQUE: Bilateral screening digital craniocaudal and mediolateral oblique
mammograms were obtained. Bilateral screening digital breast
tomosynthesis was performed. The images were evaluated with
computer-aided detection.

[L CV synth-2D]
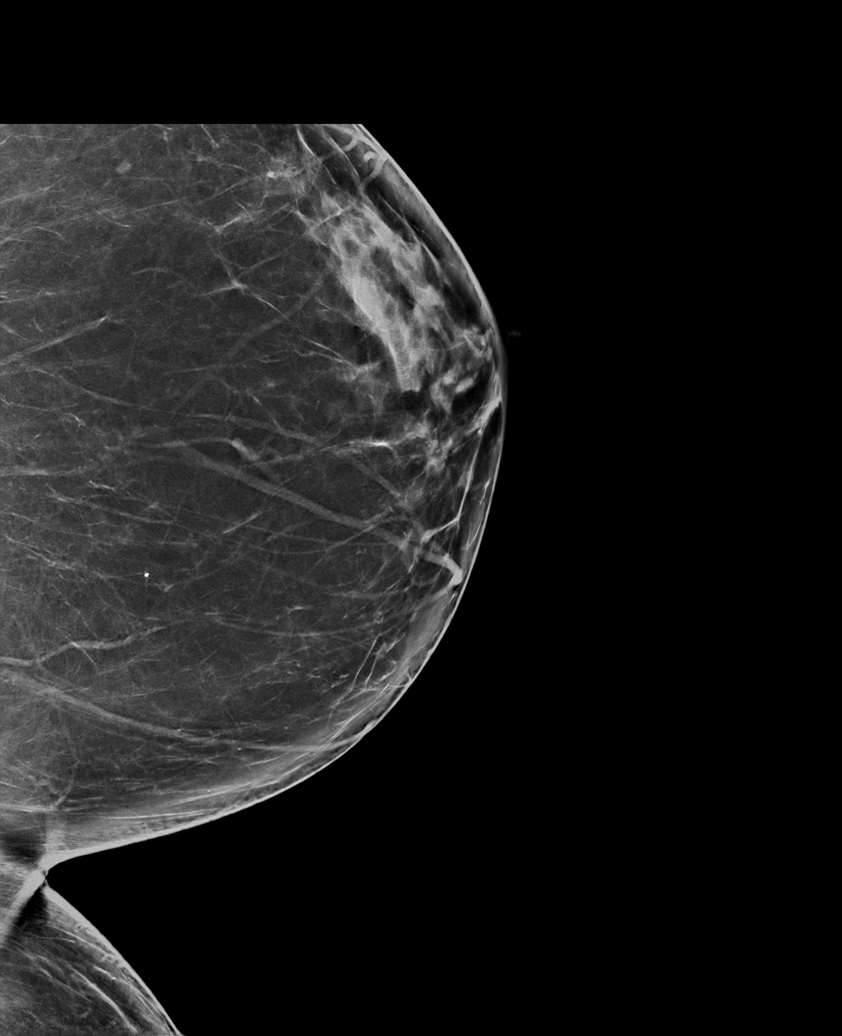

[R MLO synth-2D]
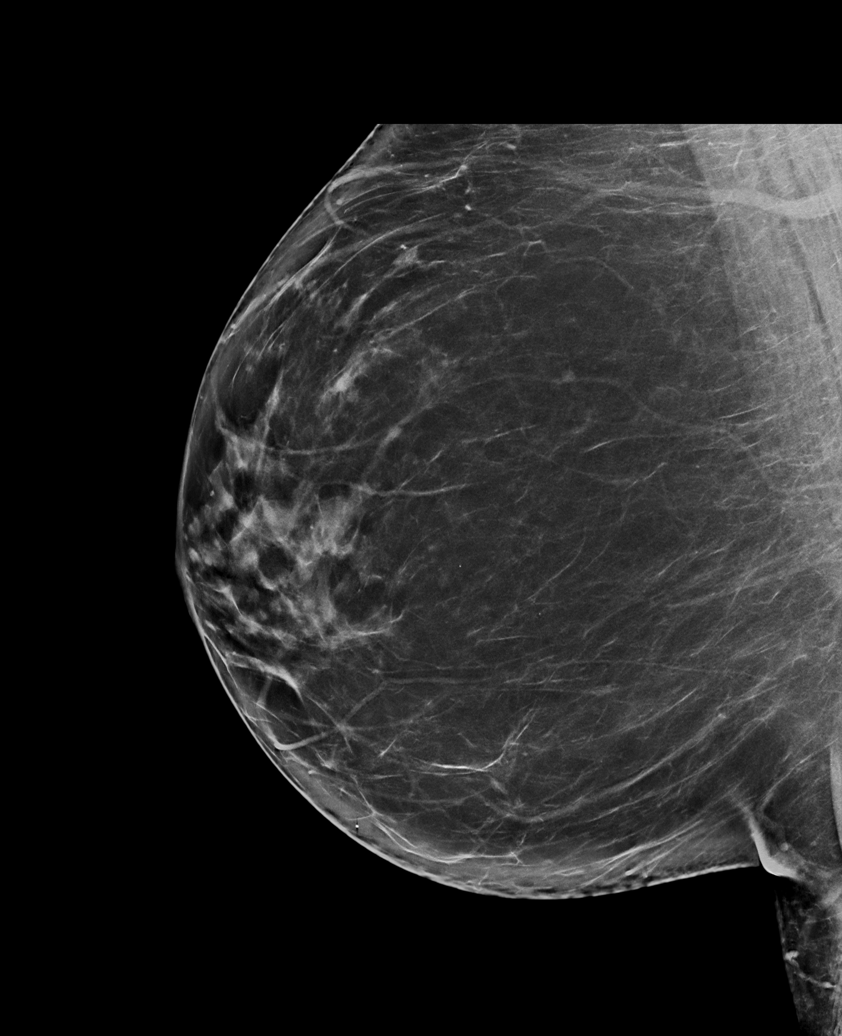

[L MLO synth-2D]
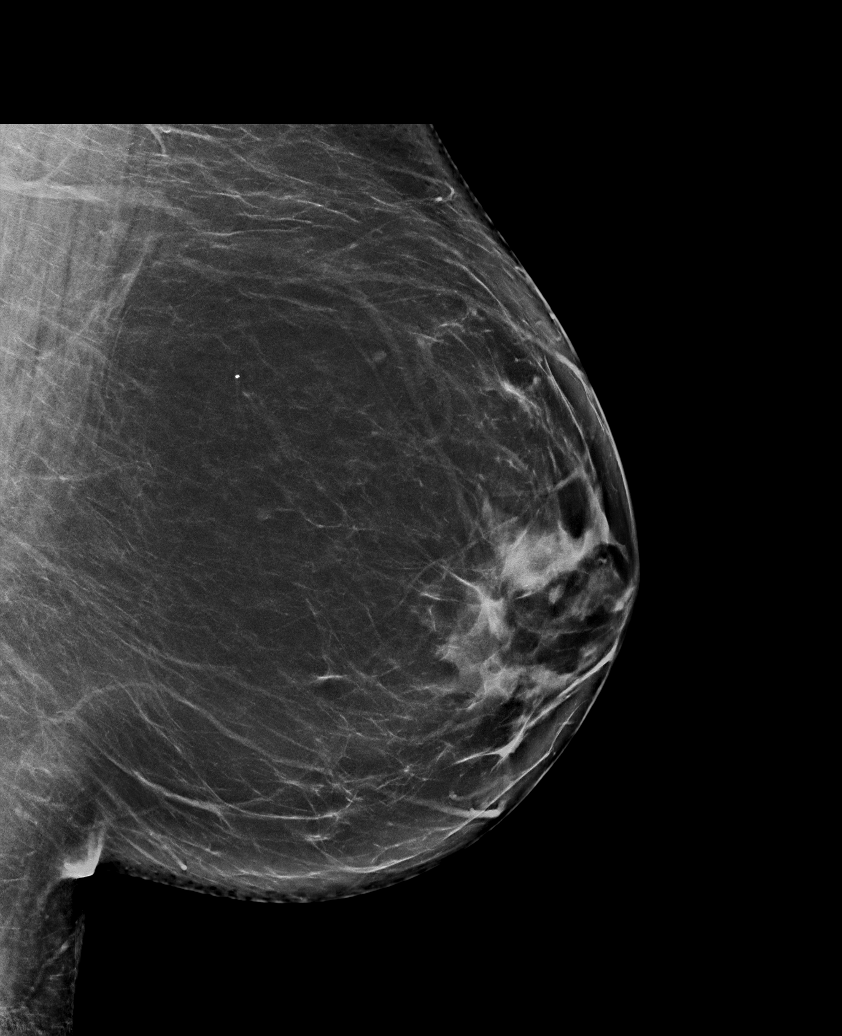

[R CC synth-2D]
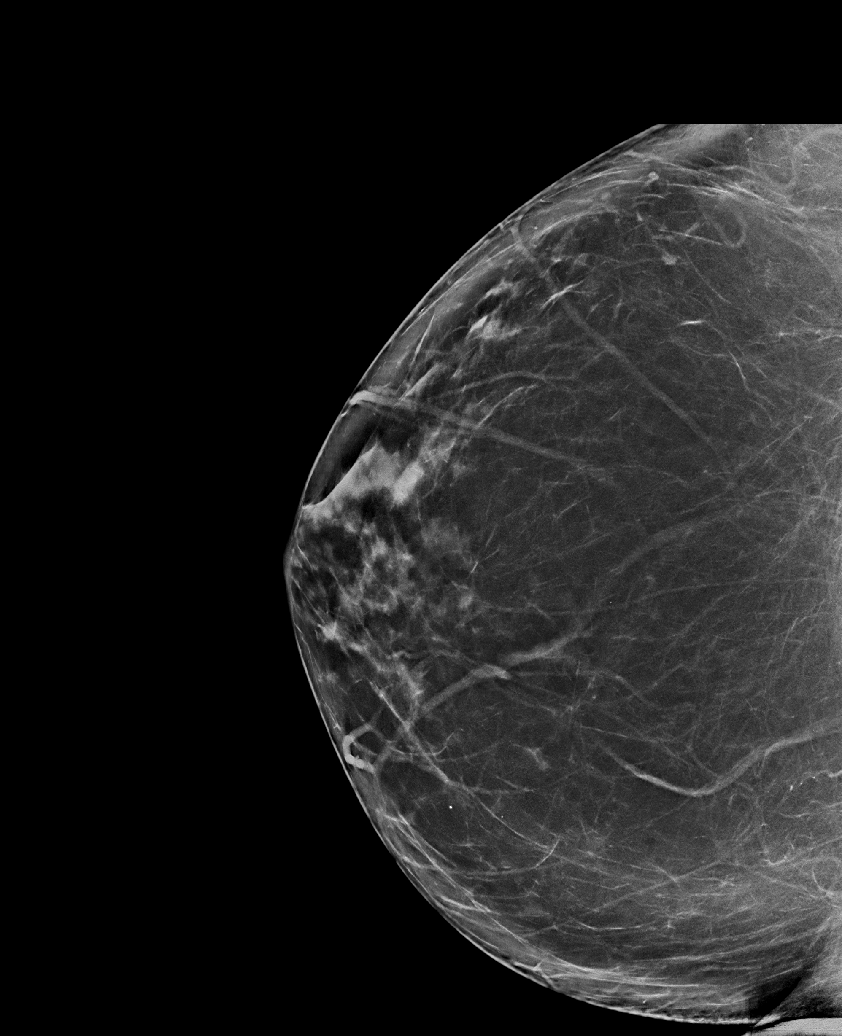

[R CV synth-2D]
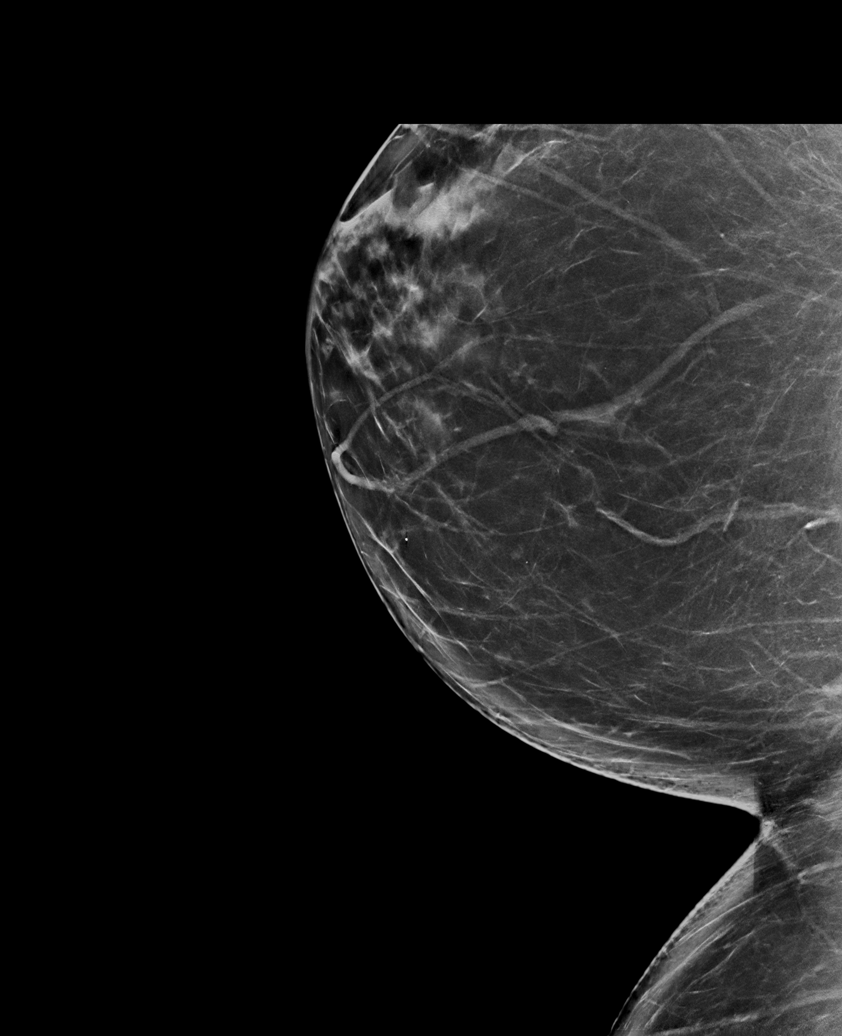

[L CC synth-2D]
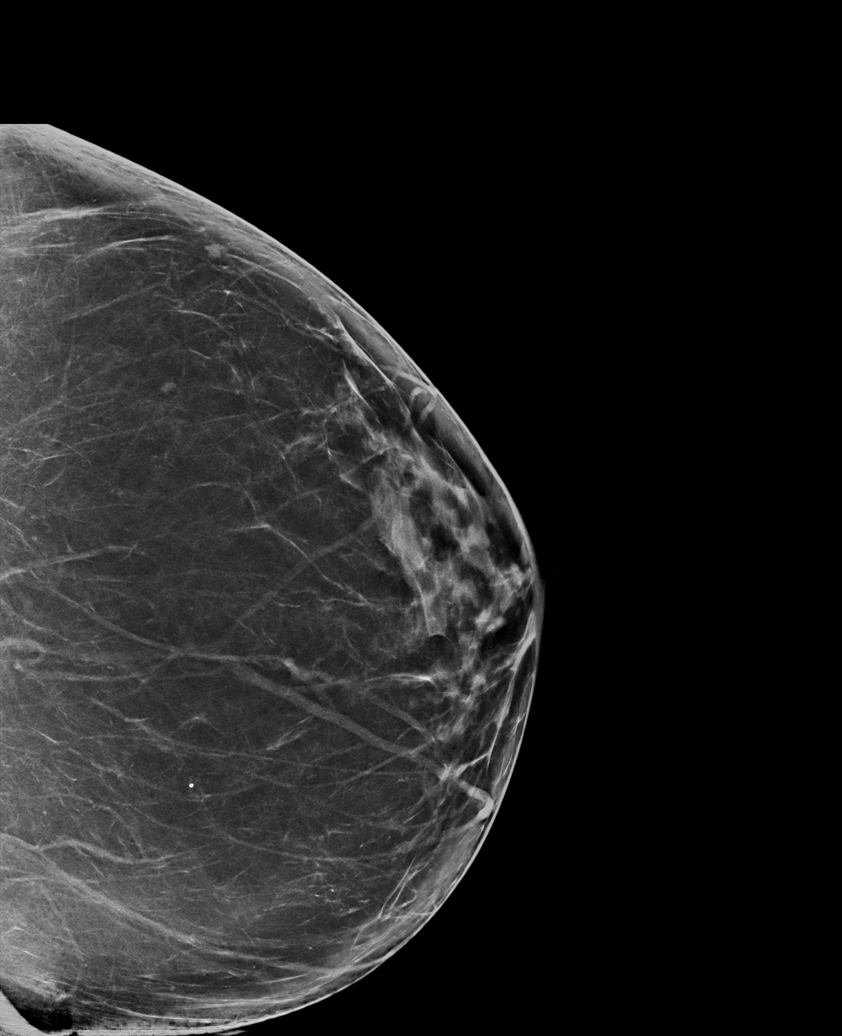

[6 of 36 positions shown; findings below may reference images not displayed]

ACR Breast Density Category b: There are scattered areas of
fibroglandular density.
FINDINGS: There are no findings suspicious for malignancy.
IMPRESSION: No mammographic evidence of malignancy. A result letter of this
screening mammogram will be mailed directly to the patient.

RECOMMENDATION:
Screening mammogram in one year. (Code:51-O-LD2)

BI-RADS CATEGORY  1: Negative.

## 2022-12-11 DIAGNOSIS — E785 Hyperlipidemia, unspecified: Secondary | ICD-10-CM | POA: Diagnosis not present

## 2022-12-11 DIAGNOSIS — Z1211 Encounter for screening for malignant neoplasm of colon: Secondary | ICD-10-CM | POA: Diagnosis not present

## 2022-12-11 DIAGNOSIS — Z1231 Encounter for screening mammogram for malignant neoplasm of breast: Secondary | ICD-10-CM | POA: Diagnosis not present

## 2022-12-11 DIAGNOSIS — E039 Hypothyroidism, unspecified: Secondary | ICD-10-CM | POA: Diagnosis not present

## 2022-12-15 ENCOUNTER — Other Ambulatory Visit: Payer: Self-pay | Admitting: Family Medicine

## 2022-12-15 DIAGNOSIS — E039 Hypothyroidism, unspecified: Secondary | ICD-10-CM

## 2022-12-15 NOTE — Telephone Encounter (Signed)
Pt has moved to [place], [place]. No longer under Dr. Synthia Innocent care. (See 09/17/22 pt msg.)   Request denied. Plz remove Dr. Darnell Level as PCP.

## 2022-12-17 DIAGNOSIS — E538 Deficiency of other specified B group vitamins: Secondary | ICD-10-CM | POA: Diagnosis not present

## 2022-12-17 DIAGNOSIS — E559 Vitamin D deficiency, unspecified: Secondary | ICD-10-CM | POA: Diagnosis not present

## 2022-12-17 DIAGNOSIS — E039 Hypothyroidism, unspecified: Secondary | ICD-10-CM | POA: Diagnosis not present

## 2022-12-17 DIAGNOSIS — E785 Hyperlipidemia, unspecified: Secondary | ICD-10-CM | POA: Diagnosis not present

## 2023-02-24 DIAGNOSIS — Z1231 Encounter for screening mammogram for malignant neoplasm of breast: Secondary | ICD-10-CM | POA: Diagnosis not present

## 2023-04-08 DIAGNOSIS — E785 Hyperlipidemia, unspecified: Secondary | ICD-10-CM | POA: Diagnosis not present

## 2023-04-08 DIAGNOSIS — Z780 Asymptomatic menopausal state: Secondary | ICD-10-CM | POA: Diagnosis not present

## 2023-04-08 DIAGNOSIS — Z0000 Encounter for general adult medical examination without abnormal findings: Secondary | ICD-10-CM | POA: Diagnosis not present

## 2023-04-08 DIAGNOSIS — M8580 Other specified disorders of bone density and structure, unspecified site: Secondary | ICD-10-CM | POA: Diagnosis not present

## 2023-05-07 DIAGNOSIS — Z780 Asymptomatic menopausal state: Secondary | ICD-10-CM | POA: Diagnosis not present

## 2023-05-26 DIAGNOSIS — K5730 Diverticulosis of large intestine without perforation or abscess without bleeding: Secondary | ICD-10-CM | POA: Diagnosis not present

## 2023-05-26 DIAGNOSIS — K648 Other hemorrhoids: Secondary | ICD-10-CM | POA: Diagnosis not present

## 2023-05-26 DIAGNOSIS — Z86010 Personal history of colonic polyps: Secondary | ICD-10-CM | POA: Diagnosis not present

## 2023-05-26 DIAGNOSIS — Z1211 Encounter for screening for malignant neoplasm of colon: Secondary | ICD-10-CM | POA: Diagnosis not present

## 2023-05-28 DIAGNOSIS — R42 Dizziness and giddiness: Secondary | ICD-10-CM | POA: Diagnosis not present

## 2023-05-28 DIAGNOSIS — Z6835 Body mass index (BMI) 35.0-35.9, adult: Secondary | ICD-10-CM | POA: Diagnosis not present

## 2023-05-28 DIAGNOSIS — H9190 Unspecified hearing loss, unspecified ear: Secondary | ICD-10-CM | POA: Diagnosis not present

## 2023-07-09 DIAGNOSIS — M5136 Other intervertebral disc degeneration, lumbar region with discogenic back pain only: Secondary | ICD-10-CM | POA: Diagnosis not present

## 2023-07-09 DIAGNOSIS — M8580 Other specified disorders of bone density and structure, unspecified site: Secondary | ICD-10-CM | POA: Diagnosis not present

## 2023-07-09 DIAGNOSIS — E039 Hypothyroidism, unspecified: Secondary | ICD-10-CM | POA: Diagnosis not present

## 2023-07-09 DIAGNOSIS — M810 Age-related osteoporosis without current pathological fracture: Secondary | ICD-10-CM | POA: Diagnosis not present

## 2023-07-09 DIAGNOSIS — M47812 Spondylosis without myelopathy or radiculopathy, cervical region: Secondary | ICD-10-CM | POA: Diagnosis not present

## 2023-07-09 DIAGNOSIS — M549 Dorsalgia, unspecified: Secondary | ICD-10-CM | POA: Diagnosis not present

## 2023-07-09 DIAGNOSIS — M47816 Spondylosis without myelopathy or radiculopathy, lumbar region: Secondary | ICD-10-CM | POA: Diagnosis not present

## 2023-07-09 DIAGNOSIS — M542 Cervicalgia: Secondary | ICD-10-CM | POA: Diagnosis not present

## 2023-07-15 DIAGNOSIS — H8193 Unspecified disorder of vestibular function, bilateral: Secondary | ICD-10-CM | POA: Diagnosis not present

## 2023-07-15 DIAGNOSIS — M542 Cervicalgia: Secondary | ICD-10-CM | POA: Diagnosis not present

## 2023-07-15 DIAGNOSIS — M549 Dorsalgia, unspecified: Secondary | ICD-10-CM | POA: Diagnosis not present

## 2023-07-15 DIAGNOSIS — H25813 Combined forms of age-related cataract, bilateral: Secondary | ICD-10-CM | POA: Diagnosis not present

## 2023-07-17 DIAGNOSIS — H8193 Unspecified disorder of vestibular function, bilateral: Secondary | ICD-10-CM | POA: Diagnosis not present

## 2023-07-17 DIAGNOSIS — M542 Cervicalgia: Secondary | ICD-10-CM | POA: Diagnosis not present

## 2023-07-17 DIAGNOSIS — M549 Dorsalgia, unspecified: Secondary | ICD-10-CM | POA: Diagnosis not present

## 2023-07-22 DIAGNOSIS — M542 Cervicalgia: Secondary | ICD-10-CM | POA: Diagnosis not present

## 2023-07-22 DIAGNOSIS — H8193 Unspecified disorder of vestibular function, bilateral: Secondary | ICD-10-CM | POA: Diagnosis not present

## 2023-07-22 DIAGNOSIS — M549 Dorsalgia, unspecified: Secondary | ICD-10-CM | POA: Diagnosis not present

## 2023-07-24 DIAGNOSIS — M542 Cervicalgia: Secondary | ICD-10-CM | POA: Diagnosis not present

## 2023-07-24 DIAGNOSIS — M549 Dorsalgia, unspecified: Secondary | ICD-10-CM | POA: Diagnosis not present

## 2023-07-24 DIAGNOSIS — H8193 Unspecified disorder of vestibular function, bilateral: Secondary | ICD-10-CM | POA: Diagnosis not present

## 2023-07-27 DIAGNOSIS — M549 Dorsalgia, unspecified: Secondary | ICD-10-CM | POA: Diagnosis not present

## 2023-07-27 DIAGNOSIS — H8193 Unspecified disorder of vestibular function, bilateral: Secondary | ICD-10-CM | POA: Diagnosis not present

## 2023-07-27 DIAGNOSIS — M542 Cervicalgia: Secondary | ICD-10-CM | POA: Diagnosis not present

## 2023-07-29 DIAGNOSIS — H8193 Unspecified disorder of vestibular function, bilateral: Secondary | ICD-10-CM | POA: Diagnosis not present

## 2023-07-29 DIAGNOSIS — M542 Cervicalgia: Secondary | ICD-10-CM | POA: Diagnosis not present

## 2023-07-29 DIAGNOSIS — M549 Dorsalgia, unspecified: Secondary | ICD-10-CM | POA: Diagnosis not present

## 2023-08-05 DIAGNOSIS — H8193 Unspecified disorder of vestibular function, bilateral: Secondary | ICD-10-CM | POA: Diagnosis not present

## 2023-08-05 DIAGNOSIS — M542 Cervicalgia: Secondary | ICD-10-CM | POA: Diagnosis not present

## 2023-08-05 DIAGNOSIS — M549 Dorsalgia, unspecified: Secondary | ICD-10-CM | POA: Diagnosis not present

## 2023-08-07 DIAGNOSIS — M549 Dorsalgia, unspecified: Secondary | ICD-10-CM | POA: Diagnosis not present

## 2023-08-07 DIAGNOSIS — M542 Cervicalgia: Secondary | ICD-10-CM | POA: Diagnosis not present

## 2023-08-07 DIAGNOSIS — H8193 Unspecified disorder of vestibular function, bilateral: Secondary | ICD-10-CM | POA: Diagnosis not present

## 2023-08-14 DIAGNOSIS — M542 Cervicalgia: Secondary | ICD-10-CM | POA: Diagnosis not present

## 2023-08-14 DIAGNOSIS — M549 Dorsalgia, unspecified: Secondary | ICD-10-CM | POA: Diagnosis not present

## 2023-08-14 DIAGNOSIS — H8193 Unspecified disorder of vestibular function, bilateral: Secondary | ICD-10-CM | POA: Diagnosis not present

## 2023-09-17 DIAGNOSIS — L299 Pruritus, unspecified: Secondary | ICD-10-CM | POA: Diagnosis not present

## 2023-09-17 DIAGNOSIS — R21 Rash and other nonspecific skin eruption: Secondary | ICD-10-CM | POA: Diagnosis not present

## 2023-10-01 DIAGNOSIS — E039 Hypothyroidism, unspecified: Secondary | ICD-10-CM | POA: Diagnosis not present

## 2023-10-01 DIAGNOSIS — R21 Rash and other nonspecific skin eruption: Secondary | ICD-10-CM | POA: Diagnosis not present
